# Patient Record
Sex: Male | Born: 1935 | Race: Black or African American | Hispanic: No | Smoking: Never smoker
Health system: Southern US, Community
[De-identification: ages and names within clinical notes are randomized; demographics above are authoritative.]

## PROBLEM LIST (undated history)

## (undated) DIAGNOSIS — E7800 Pure hypercholesterolemia, unspecified: Secondary | ICD-10-CM

## (undated) DIAGNOSIS — I10 Essential (primary) hypertension: Secondary | ICD-10-CM

## (undated) DIAGNOSIS — C61 Malignant neoplasm of prostate: Secondary | ICD-10-CM

## (undated) DIAGNOSIS — H8109 Meniere's disease, unspecified ear: Secondary | ICD-10-CM

## (undated) HISTORY — PX: OTHER SURGICAL HISTORY: SHX169

## (undated) HISTORY — PX: PACEMAKER IMPLANT: EP1218

---

## 2001-11-08 ENCOUNTER — Emergency Department (HOSPITAL_COMMUNITY): Admission: EM | Admit: 2001-11-08 | Discharge: 2001-11-08 | Payer: Self-pay | Admitting: Emergency Medicine

## 2002-01-03 ENCOUNTER — Encounter: Admission: RE | Admit: 2002-01-03 | Discharge: 2002-01-03 | Payer: Self-pay | Admitting: *Deleted

## 2002-01-03 ENCOUNTER — Encounter: Payer: Self-pay | Admitting: *Deleted

## 2002-01-24 ENCOUNTER — Ambulatory Visit (HOSPITAL_COMMUNITY): Admission: RE | Admit: 2002-01-24 | Discharge: 2002-01-24 | Payer: Self-pay | Admitting: *Deleted

## 2002-03-16 ENCOUNTER — Emergency Department (HOSPITAL_COMMUNITY): Admission: EM | Admit: 2002-03-16 | Discharge: 2002-03-16 | Payer: Self-pay | Admitting: Emergency Medicine

## 2002-07-15 ENCOUNTER — Encounter: Admission: RE | Admit: 2002-07-15 | Discharge: 2002-07-15 | Payer: Self-pay | Admitting: Urology

## 2002-07-15 ENCOUNTER — Encounter: Payer: Self-pay | Admitting: Urology

## 2002-11-15 ENCOUNTER — Encounter: Payer: Self-pay | Admitting: Family Medicine

## 2002-11-15 ENCOUNTER — Encounter: Admission: RE | Admit: 2002-11-15 | Discharge: 2002-11-15 | Payer: Self-pay | Admitting: Family Medicine

## 2003-06-04 ENCOUNTER — Encounter: Payer: Self-pay | Admitting: Emergency Medicine

## 2003-06-04 ENCOUNTER — Inpatient Hospital Stay (HOSPITAL_COMMUNITY): Admission: EM | Admit: 2003-06-04 | Discharge: 2003-06-05 | Payer: Self-pay | Admitting: Emergency Medicine

## 2004-09-27 ENCOUNTER — Emergency Department (HOSPITAL_COMMUNITY): Admission: EM | Admit: 2004-09-27 | Discharge: 2004-09-27 | Payer: Self-pay | Admitting: Emergency Medicine

## 2005-02-11 ENCOUNTER — Encounter: Admission: RE | Admit: 2005-02-11 | Discharge: 2005-02-11 | Payer: Self-pay | Admitting: Internal Medicine

## 2006-10-18 ENCOUNTER — Encounter: Admission: RE | Admit: 2006-10-18 | Discharge: 2006-10-18 | Payer: Self-pay | Admitting: Gastroenterology

## 2007-05-17 ENCOUNTER — Encounter: Admission: RE | Admit: 2007-05-17 | Discharge: 2007-05-17 | Payer: Self-pay | Admitting: Internal Medicine

## 2008-08-04 ENCOUNTER — Encounter: Admission: RE | Admit: 2008-08-04 | Discharge: 2008-08-04 | Payer: Self-pay | Admitting: Internal Medicine

## 2008-08-15 ENCOUNTER — Ambulatory Visit (HOSPITAL_COMMUNITY): Admission: RE | Admit: 2008-08-15 | Discharge: 2008-08-15 | Payer: Self-pay | Admitting: Internal Medicine

## 2010-01-13 ENCOUNTER — Emergency Department (HOSPITAL_COMMUNITY): Admission: EM | Admit: 2010-01-13 | Discharge: 2010-01-13 | Payer: Self-pay | Admitting: Emergency Medicine

## 2010-06-05 ENCOUNTER — Emergency Department (HOSPITAL_COMMUNITY): Admission: EM | Admit: 2010-06-05 | Discharge: 2010-06-06 | Payer: Self-pay | Admitting: Emergency Medicine

## 2010-12-04 ENCOUNTER — Emergency Department (HOSPITAL_COMMUNITY)
Admission: EM | Admit: 2010-12-04 | Discharge: 2010-12-04 | Disposition: A | Discharge status: Home / Self Care | Payer: Medicare Other | Attending: Emergency Medicine | Admitting: Emergency Medicine

## 2010-12-04 ENCOUNTER — Emergency Department (HOSPITAL_COMMUNITY): Payer: Medicare Other

## 2010-12-04 DIAGNOSIS — M79609 Pain in unspecified limb: Secondary | ICD-10-CM | POA: Insufficient documentation

## 2010-12-04 DIAGNOSIS — M25569 Pain in unspecified knee: Secondary | ICD-10-CM | POA: Insufficient documentation

## 2010-12-04 DIAGNOSIS — W11XXXA Fall on and from ladder, initial encounter: Secondary | ICD-10-CM | POA: Insufficient documentation

## 2010-12-04 DIAGNOSIS — E785 Hyperlipidemia, unspecified: Secondary | ICD-10-CM | POA: Insufficient documentation

## 2010-12-04 DIAGNOSIS — R209 Unspecified disturbances of skin sensation: Secondary | ICD-10-CM | POA: Insufficient documentation

## 2010-12-04 DIAGNOSIS — I10 Essential (primary) hypertension: Secondary | ICD-10-CM | POA: Insufficient documentation

## 2010-12-04 DIAGNOSIS — Z8546 Personal history of malignant neoplasm of prostate: Secondary | ICD-10-CM | POA: Insufficient documentation

## 2010-12-04 DIAGNOSIS — M5412 Radiculopathy, cervical region: Secondary | ICD-10-CM | POA: Insufficient documentation

## 2010-12-04 DIAGNOSIS — M25469 Effusion, unspecified knee: Secondary | ICD-10-CM | POA: Insufficient documentation

## 2010-12-04 DIAGNOSIS — Z79899 Other long term (current) drug therapy: Secondary | ICD-10-CM | POA: Insufficient documentation

## 2010-12-04 DIAGNOSIS — M25519 Pain in unspecified shoulder: Secondary | ICD-10-CM | POA: Insufficient documentation

## 2010-12-04 DIAGNOSIS — M546 Pain in thoracic spine: Secondary | ICD-10-CM | POA: Insufficient documentation

## 2010-12-04 LAB — POCT CARDIAC MARKERS: Myoglobin, poc: 335 ng/mL (ref 12–200)

## 2010-12-23 LAB — CBC
HCT: 38.7 % — ABNORMAL LOW (ref 39.0–52.0)
Hemoglobin: 13.3 g/dL (ref 13.0–17.0)
MCV: 93.3 fL (ref 78.0–100.0)
RBC: 4.15 MIL/uL — ABNORMAL LOW (ref 4.22–5.81)
RDW: 12 % (ref 11.5–15.5)
WBC: 7.9 10*3/uL (ref 4.0–10.5)

## 2010-12-23 LAB — DIFFERENTIAL
Basophils Absolute: 0 10*3/uL (ref 0.0–0.1)
Monocytes Absolute: 0.8 10*3/uL (ref 0.1–1.0)

## 2010-12-23 LAB — ROCKY MTN SPOTTED FVR AB, IGM-BLOOD: RMSF IgM: 0.12 IV (ref 0.00–0.89)

## 2010-12-23 LAB — BASIC METABOLIC PANEL
BUN: 12 mg/dL (ref 6–23)
Chloride: 109 mEq/L (ref 96–112)
Creatinine, Ser: 1.45 mg/dL (ref 0.4–1.5)
GFR calc non Af Amer: 48 mL/min — ABNORMAL LOW (ref >60)
Glucose, Bld: 131 mg/dL — ABNORMAL HIGH (ref 70–99)
Potassium: 4.2 mEq/L (ref 3.5–5.1)

## 2010-12-29 LAB — URINALYSIS, ROUTINE W REFLEX MICROSCOPIC
Hgb urine dipstick: NEGATIVE
Ketones, ur: 15 mg/dL — AB
Leukocytes, UA: NEGATIVE
Protein, ur: 30 mg/dL — AB
pH: 7 (ref 5.0–8.0)

## 2010-12-29 LAB — DIFFERENTIAL
Basophils Absolute: 0 10*3/uL (ref 0.0–0.1)
Eosinophils Absolute: 0 10*3/uL (ref 0.0–0.7)
Eosinophils Relative: 0 % (ref 0–5)
Lymphocytes Relative: 6 % — ABNORMAL LOW (ref 12–46)
Monocytes Relative: 9 % (ref 3–12)
Neutro Abs: 7.6 10*3/uL (ref 1.7–7.7)
Neutrophils Relative %: 85 % — ABNORMAL HIGH (ref 43–77)

## 2010-12-29 LAB — POCT I-STAT, CHEM 8
Calcium, Ion: 1.15 mmol/L (ref 1.12–1.32)
Chloride: 105 mEq/L (ref 96–112)
HCT: 50 % (ref 39.0–52.0)
Hemoglobin: 17 g/dL (ref 13.0–17.0)
Sodium: 141 mEq/L (ref 135–145)

## 2010-12-29 LAB — CBC
MCHC: 34.1 g/dL (ref 30.0–36.0)
MCV: 94.8 fL (ref 78.0–100.0)
RBC: 4.83 MIL/uL (ref 4.22–5.81)
RDW: 12.7 % (ref 11.5–15.5)
WBC: 8.9 10*3/uL (ref 4.0–10.5)

## 2010-12-29 LAB — URINE MICROSCOPIC-ADD ON

## 2011-02-25 NOTE — Discharge Summary (Signed)
   NAME:  Richard Osborne, Richard Osborne NO.:  [id number]   MEDICAL RECORD NO.:  [id number]                   PATIENT TYPE:  INP   LOCATION:  0345                                 FACILITY:  [facility]   PHYSICIAN:  Olene Craven, M.D.            DATE OF BIRTH:  September 28, 1934   DATE OF ADMISSION:  06/04/2003  DATE OF DISCHARGE:  06/05/2003                                 DISCHARGE SUMMARY   DISCHARGE DIAGNOSES:  1. Atypical chest pain.  2. Hypertension.  3. Benign prostatic hypertrophy.  4. Hyperlipidemia.   DISCHARGE MEDICATIONS:  1. Diovan HCT 160/12.5 mg p.o. daily.  2. Flomax 0.4 mg p.o. q.h.s.  3. Norvasc 5 mg p.o. daily.  4. Zetia 10 mg p.o. daily.  5. Zantac over-the-counter daily.   CONSULTATIONS:  Dr. Nicki Guadalajara, [facility].   PROCEDURES:  None.   FOLLOW-UP:  The patient has a scheduled appointment June 18, 2003, Dr.  Barbee Shropshire.  To follow up with Dr. Tresa Endo as directed.   HOSPITAL COURSE:  The patient was admitted on June 04, 2003, after  experiencing two recurring episodes of right-sided substernal chest pain not  associated with nausea, vomiting, diaphoresis, shortness of breath, or  radiation.  He did have a previous history of previous coronary  catheterization in 1999.  Records were not available at that time.  Subsequently faxed over, which showed he had a normal left ventricular  function, minimum nonstenotic coronary disease involving the proximal right  coronary artery.  No other atherosclerotic disease was identified, and this  was in November of 1999.  He was admitted for observation and rule out.  Serial enzymes were performed.  Enzymes were negative.  It was felt that the  patient was stable for discharge and could be set up for an outpatient  Cardiolite.  The Cardiolite will be scheduled.  He is being discharged in  stable condition, to follow up as above.                                               Olene Craven, M.D.    DEH/MEDQ  D:  06/05/2003  T:  06/05/2003  Job:  [id number]   cc:   Nicki Guadalajara, M.D.  [phone] [address], [address]  [place], [place] [address]  Fax: [phone]

## 2011-02-25 NOTE — H&P (Signed)
NAME:  Richard Osborne, Richard Osborne NO.:  [id number]   MEDICAL RECORD NO.:  [id number]                   PATIENT TYPE:  EMS   LOCATION:  ED                                   FACILITY:  [facility]   PHYSICIAN:  Wilson Singer, M.D.             DATE OF BIRTH:  Dec 18, 1933   DATE OF ADMISSION:  06/04/2003  DATE OF DISCHARGE:                                HISTORY & PHYSICAL   HISTORY:  This is a 77 year old man who has had several episodes of chest  pain in the last 48 hours.  The pain has occurred at rest usually, and is  not associated with nausea, sweating, or shortness of breath.  He has a  background history of hypertension and hypercholesterolemia.  He is  currently pain free.   PAST MEDICAL HISTORY:  Significant for benign prostatic hyperplasia,  hypertension, and hypercholesterolemia.  No other major illnesses.   MEDICATIONS:  1. Diovan HCT 160/12.5, one tablet daily.  2. Flomax 0.4 mg daily.   ALLERGIES:  None.   SOCIAL HISTORY:  This is a divorced gentleman who lives alone.  He is  retired and used to be a Naval architect.  He is a nonsmoker.  There is no  history of excessive alcohol use.   FAMILY HISTORY:  Significant for heart disease in his mother.   REVIEW OF SYSTEMS:  There are no other symptoms referable to the other  symptoms apart from the symptoms mentioned above, referable to the  constitutional, HEENT, cardiovascular, respiratory, gastrointestinal,  neurological, musculoskeletal, dermatological, endocrine, and psychiatric  systems.   PHYSICAL EXAMINATION:  VITAL SIGNS:  He is afebrile and hemodynamically  stable.  His blood pressure is 138/96, temperature 99.1, respirations 18.  Pulse oximetry on room air is 99%.  HEART:  Heart sounds are present and normal with no murmurs or pericardial  rubs.  There is no gallop present.  LUNGS:  Lung fields are clear.  ABDOMEN:  Soft and nontender with no hepatosplenomegaly.  NEUROLOGIC:  Intact.  No  focal neurologic signs.   INVESTIGATION:  Sodium 138, potassium 3.9, BUN 12, creatinine 1.2, glucose  124.  CPK is elevated at 371 with a CPK MB elevation at 6.1.  The relative  index, however, is in the normal range at 1.6.  Troponin is also in the  normal range at 0.03.  Chest x-ray is normal with no major abnormalities.   IMPRESSION AND PLAN:  Chest pain, possibly cardiac in origin.  The history  is not consistent entirely with cardiac pain, but he does have risk factors.  We will admit him for observation, telemetry, and also serial cardiac  enzymes and electrocardiograms.  He may need a stress test or even a cardiac  catheterization on this admission.  Further recommendations will depend on  the patient's progress, and he will be admitted to the service of Dr. Kern Reap.  Wilson Singer, M.D.    NCG/MEDQ  D:  06/04/2003  T:  06/04/2003  Job:  [id number]   cc:   Olene Craven, M.D.  [address]  [address]  [place]  [place] [address]  Fax: [phone]

## 2011-10-26 ENCOUNTER — Other Ambulatory Visit: Payer: Self-pay | Admitting: Urology

## 2011-10-26 DIAGNOSIS — C61 Malignant neoplasm of prostate: Secondary | ICD-10-CM

## 2011-11-07 ENCOUNTER — Inpatient Hospital Stay (HOSPITAL_COMMUNITY)
Admission: RE | Admit: 2011-11-07 | Discharge: 2011-11-07 | Payer: Medicare Other | Source: Ambulatory Visit | Attending: Urology | Admitting: Urology

## 2011-11-07 ENCOUNTER — Encounter (HOSPITAL_COMMUNITY): Payer: Medicare Other

## 2012-03-06 ENCOUNTER — Other Ambulatory Visit (HOSPITAL_COMMUNITY): Payer: Self-pay | Admitting: Gastroenterology

## 2012-03-12 ENCOUNTER — Encounter (HOSPITAL_COMMUNITY)
Admission: RE | Admit: 2012-03-12 | Discharge: 2012-03-12 | Disposition: A | Discharge status: Home / Self Care | Payer: Medicare Other | Source: Ambulatory Visit | Attending: Gastroenterology | Admitting: Gastroenterology

## 2012-03-12 DIAGNOSIS — R109 Unspecified abdominal pain: Secondary | ICD-10-CM | POA: Insufficient documentation

## 2012-03-12 MED ORDER — SINCALIDE 5 MCG IJ SOLR
INTRAMUSCULAR | Status: AC
  Administered 2012-03-12: 1.56 ug via INTRAVENOUS
  Filled 2012-03-12: qty 5

## 2012-03-12 MED ORDER — TECHNETIUM TC 99M MEBROFENIN IV KIT
5.0000 | PACK | Freq: Once | INTRAVENOUS | Status: AC | PRN
  Administered 2012-03-12: 5 via INTRAVENOUS

## 2012-03-12 MED ORDER — SINCALIDE 5 MCG IJ SOLR
0.0200 ug/kg | Freq: Once | INTRAMUSCULAR | Status: AC
  Administered 2012-03-12: 1.56 ug via INTRAVENOUS

## 2012-05-30 ENCOUNTER — Other Ambulatory Visit: Payer: Self-pay | Admitting: Gastroenterology

## 2012-05-30 DIAGNOSIS — R1013 Epigastric pain: Secondary | ICD-10-CM

## 2012-06-01 ENCOUNTER — Ambulatory Visit
Admission: RE | Admit: 2012-06-01 | Discharge: 2012-06-01 | Disposition: A | Discharge status: Home / Self Care | Payer: Medicare Other | Source: Ambulatory Visit | Attending: Gastroenterology | Admitting: Gastroenterology

## 2012-06-01 DIAGNOSIS — R1013 Epigastric pain: Secondary | ICD-10-CM

## 2012-06-01 MED ORDER — IOHEXOL 300 MG/ML  SOLN
100.0000 mL | Freq: Once | INTRAMUSCULAR | Status: AC | PRN
  Administered 2012-06-01: 100 mL via INTRAVENOUS

## 2013-07-12 ENCOUNTER — Other Ambulatory Visit: Payer: Self-pay | Admitting: Family

## 2013-07-12 ENCOUNTER — Ambulatory Visit
Admission: RE | Admit: 2013-07-12 | Discharge: 2013-07-12 | Disposition: A | Discharge status: Home / Self Care | Payer: Medicare Other | Source: Ambulatory Visit | Attending: Family | Admitting: Family

## 2013-07-12 DIAGNOSIS — T1490XA Injury, unspecified, initial encounter: Secondary | ICD-10-CM

## 2013-09-21 ENCOUNTER — Encounter (HOSPITAL_COMMUNITY): Payer: Self-pay | Admitting: Emergency Medicine

## 2013-09-21 ENCOUNTER — Emergency Department (HOSPITAL_COMMUNITY)
Admission: EM | Admit: 2013-09-21 | Discharge: 2013-09-21 | Disposition: A | Discharge status: Home / Self Care | Payer: Medicare Other | Attending: Emergency Medicine | Admitting: Emergency Medicine

## 2013-09-21 DIAGNOSIS — T7840XA Allergy, unspecified, initial encounter: Secondary | ICD-10-CM

## 2013-09-21 DIAGNOSIS — L299 Pruritus, unspecified: Secondary | ICD-10-CM | POA: Insufficient documentation

## 2013-09-21 DIAGNOSIS — E7800 Pure hypercholesterolemia, unspecified: Secondary | ICD-10-CM | POA: Insufficient documentation

## 2013-09-21 DIAGNOSIS — Z888 Allergy status to other drugs, medicaments and biological substances status: Secondary | ICD-10-CM | POA: Insufficient documentation

## 2013-09-21 DIAGNOSIS — T50995A Adverse effect of other drugs, medicaments and biological substances, initial encounter: Secondary | ICD-10-CM | POA: Insufficient documentation

## 2013-09-21 DIAGNOSIS — I10 Essential (primary) hypertension: Secondary | ICD-10-CM | POA: Insufficient documentation

## 2013-09-21 DIAGNOSIS — L509 Urticaria, unspecified: Secondary | ICD-10-CM | POA: Insufficient documentation

## 2013-09-21 DIAGNOSIS — R21 Rash and other nonspecific skin eruption: Secondary | ICD-10-CM

## 2013-09-21 HISTORY — DX: Essential (primary) hypertension: I10

## 2013-09-21 HISTORY — DX: Pure hypercholesterolemia, unspecified: E78.00

## 2013-09-21 MED ORDER — PREDNISONE 20 MG PO TABS
40.0000 mg | ORAL_TABLET | Freq: Once | ORAL | Status: AC
  Administered 2013-09-21: 40 mg via ORAL
  Filled 2013-09-21: qty 2

## 2013-09-21 MED ORDER — LORATADINE 10 MG PO TABS
10.0000 mg | ORAL_TABLET | Freq: Once | ORAL | Status: AC
  Administered 2013-09-21: 10 mg via ORAL
  Filled 2013-09-21: qty 1

## 2013-09-21 MED ORDER — PREDNISONE 20 MG PO TABS: 40.0000 mg | ORAL_TABLET | Freq: Every day | ORAL | Status: DC

## 2013-09-21 MED ORDER — DIPHENHYDRAMINE HCL 25 MG PO TABS: 25.0000 mg | ORAL_TABLET | Freq: Four times a day (QID) | ORAL | Status: DC | PRN

## 2013-09-21 NOTE — ED Notes (Signed)
Pt reports recently started taking cialis and now having rash and itching to entire body, has since stopped taking it but has not tried any benadryl for the rash. Airway intact, no acute distress noted.

## 2013-09-21 NOTE — ED Provider Notes (Signed)
CSN: [id number]     Arrival date & time 09/21/13  1227 History   First MD Initiated Contact with Patient 09/21/13 1301     Chief Complaint  Patient presents with  . Rash  . Allergic Reaction   (Consider location/radiation/quality/duration/timing/severity/associated sxs/prior Treatment) HPI Comments: Patient is a 79 yo M PMHx significant for HTN, hypercholesteremia presenting to the ED complaining of a gradually improving pruritic rash to back, bilateral upper and lower extremities that started one week ago. Patient states he was recently put on cialis by his doctor about 10 days ago. He states a few days after beginning the medication he developed the rash. He states he has not tried any Benadryl but did use some hydrocortisone cream. He states that has helped his rash. He denies any SOB or oropharyngeal swelling. Denies fevers. Patient has discontinued the drug after about 7 days of use.   Patient is a 79 y.o. male presenting with rash and allergic reaction.  Rash Associated symptoms: no fever, no shortness of breath and not wheezing   Allergic Reaction Presenting symptoms: rash   Presenting symptoms: no wheezing     Past Medical History  Diagnosis Date  . Hypertension   . High cholesterol    History reviewed. No pertinent past surgical history. History reviewed. No pertinent family history. History  Substance Use Topics  . Smoking status: Not on file  . Smokeless tobacco: Not on file  . Alcohol Use: No    Review of Systems  Constitutional: Negative for fever and diaphoresis.  Respiratory: Negative for shortness of breath, wheezing and stridor.   Skin: Positive for rash.  All other systems reviewed and are negative.    Allergies  Review of patient's allergies indicates no known allergies.  Home Medications   Current Outpatient Rx  Name  Route  Sig  Dispense  Refill  . diphenhydrAMINE (BENADRYL) 25 MG tablet   Oral   Take 1 tablet (25 mg total) by mouth every 6  (six) hours as needed for itching (Rash).   30 tablet   0   . predniSONE (DELTASONE) 20 MG tablet   Oral   Take 2 tablets (40 mg total) by mouth daily.   8 tablet   0    BP 114/67  Pulse 65  Temp(Src) 97.5 F (36.4 C) (Oral)  Resp 17  Ht 5\' 10"  (1.778 m)  Wt 184 lb 11.2 oz (83.779 kg)  BMI 26.50 kg/m2  SpO2 99% Physical Exam  Constitutional: He is oriented to person, place, and time. He appears well-developed and well-nourished. No distress.  HENT:  Head: Normocephalic and atraumatic.  Right Ear: External ear normal.  Left Ear: External ear normal.  Nose: Nose normal.  Mouth/Throat: Oropharynx is clear and moist. No oropharyngeal exudate.  Eyes: Conjunctivae are normal.  Neck: Normal range of motion. Neck supple.  Cardiovascular: Normal rate, regular rhythm, normal heart sounds and intact distal pulses.   Pulmonary/Chest: Effort normal and breath sounds normal. No stridor. No respiratory distress.  Abdominal: Soft. There is no tenderness.  Musculoskeletal: Normal range of motion.  Lymphadenopathy:    He has no cervical adenopathy.  Neurological: He is alert and oriented to person, place, and time.  Skin: Skin is warm and dry. Rash noted. No bruising, no ecchymosis and no petechiae noted. Rash is urticarial. He is not diaphoretic.     Area of rash marked. No drainage. Non-TTP.   Psychiatric: He has a normal mood and affect.    ED Course  Procedures (including critical care time) Medications  loratadine (CLARITIN) tablet 10 mg (10 mg Oral Given 09/21/13 1410)  predniSONE (DELTASONE) tablet 40 mg (40 mg Oral Given 09/21/13 1410)    Labs Review Labs Reviewed - No data to display Imaging Review No results found.  EKG Interpretation   None       MDM   1. Rash   2. Allergic reaction caused by a drug     Afebrile, NAD, non-toxic appearing, AAOx4. Patient re-evaluated prior to dc, is hemodynamically stable, in no respiratory distress, and denies the feeling  of throat closing. Pt has been advised to take OTC benadryl & return to the ED if they have a mod-severe allergic rxn (s/s including throat closing, difficulty breathing, swelling of lips face or tongue). Pt given short Prednisone course to help with rash and itching. Pt is to follow up with their PCP. Pt is agreeable with plan & verbalizes understanding. Patient d/w with Dr. Oletta Lamas, agrees with plan.        Jeannetta Ellis, PA-C 09/21/13 1446

## 2013-09-21 NOTE — ED Provider Notes (Signed)
Medical screening examination/treatment/procedure(s) were conducted as a shared visit with non-physician practitioner(s) and myself.  I personally evaluated the patient during the encounter.  EKG Interpretation   None       Pt with pruritic rash that he thinks began a few days after beginning [place]ais.  He noted itching mild at first along lower legs, lower back, but continued taking medication.  No difficulty breathing, no n/v, no fainting.  Pt stopped taking it 3 days ago and rash is improving but still very itchy on left lower leg.  Has not taken anything for itching.  Will recommend benadryl for symptoms, short course of steroids and follow up with PMD as needed.    Gavin Pound. Fabiano Ginley, MD 09/21/13 1410

## 2014-05-07 ENCOUNTER — Ambulatory Visit
Admission: RE | Admit: 2014-05-07 | Discharge: 2014-05-07 | Disposition: A | Discharge status: Home / Self Care | Payer: Medicare Other | Source: Ambulatory Visit | Attending: Family | Admitting: Family

## 2014-05-07 ENCOUNTER — Other Ambulatory Visit: Payer: Self-pay | Admitting: Family

## 2014-05-07 DIAGNOSIS — M79642 Pain in left hand: Secondary | ICD-10-CM

## 2014-05-24 ENCOUNTER — Encounter (HOSPITAL_COMMUNITY): Payer: Self-pay | Admitting: Emergency Medicine

## 2014-05-24 ENCOUNTER — Emergency Department (INDEPENDENT_AMBULATORY_CARE_PROVIDER_SITE_OTHER)
Admission: EM | Admit: 2014-05-24 | Discharge: 2014-05-24 | Disposition: A | Discharge status: Home / Self Care | Payer: Medicare Other | Source: Home / Self Care | Attending: Family Medicine | Admitting: Family Medicine

## 2014-05-24 DIAGNOSIS — J029 Acute pharyngitis, unspecified: Secondary | ICD-10-CM

## 2014-05-24 DIAGNOSIS — L559 Sunburn, unspecified: Secondary | ICD-10-CM

## 2014-05-24 LAB — POCT RAPID STREP A: Streptococcus, Group A Screen (Direct): NEGATIVE

## 2014-05-24 NOTE — Discharge Instructions (Signed)
Pharyngitis Pharyngitis is redness, pain, and swelling (inflammation) of your pharynx.  CAUSES  Pharyngitis is usually caused by infection. Most of the time, these infections are from viruses (viral) and are part of a cold. However, sometimes pharyngitis is caused by bacteria (bacterial). Pharyngitis can also be caused by allergies. Viral pharyngitis may be spread from person to person by coughing, sneezing, and personal items or utensils (cups, forks, spoons, toothbrushes). Bacterial pharyngitis may be spread from person to person by more intimate contact, such as kissing.  SIGNS AND SYMPTOMS  Symptoms of pharyngitis include:   Sore throat.   Tiredness (fatigue).   Low-grade fever.   Headache.  Joint pain and muscle aches.  Skin rashes.  Swollen lymph nodes.  Plaque-like film on throat or tonsils (often seen with bacterial pharyngitis). DIAGNOSIS  Your health care provider will ask you questions about your illness and your symptoms. Your medical history, along with a physical exam, is often all that is needed to diagnose pharyngitis. Sometimes, a rapid strep test is done. Other lab tests may also be done, depending on the suspected cause.  TREATMENT  Viral pharyngitis will usually get better in 3-4 days without the use of medicine. Bacterial pharyngitis is treated with medicines that kill germs (antibiotics).  HOME CARE INSTRUCTIONS   Drink enough water and fluids to keep your urine clear or pale yellow.   Only take over-the-counter or prescription medicines as directed by your health care provider:   If you are prescribed antibiotics, make sure you finish them even if you start to feel better.   Do not take aspirin.   Get lots of rest.   Gargle with 8 oz of salt water ( tsp of salt per 1 qt of water) as often as every 1-2 hours to soothe your throat.   Throat lozenges (if you are not at risk for choking) or sprays may be used to soothe your throat. SEEK MEDICAL  CARE IF:   You have large, tender lumps in your neck.  You have a rash.  You cough up green, yellow-brown, or bloody spit. SEEK IMMEDIATE MEDICAL CARE IF:   Your neck becomes stiff.  You drool or are unable to swallow liquids.  You vomit or are unable to keep medicines or liquids down.  You have severe pain that does not go away with the use of recommended medicines.  You have trouble breathing (not caused by a stuffy nose). MAKE SURE YOU:   Understand these instructions.  Will watch your condition.  Will get help right away if you are not doing well or get worse. Document Released: 09/26/2005 Document Revised: 07/17/2013 Document Reviewed: 06/03/2013 [facility] Patient Information 2015 [place], [place]. This information is not intended to replace advice given to you by your health care provider. Make sure you discuss any questions you have with your health care provider.  Sunburn Sunburn is damage to the skin caused by overexposure to ultraviolet (UV) rays. People with light skin or a fair complexion may be more susceptible to sunburn. Repeated sun exposure causes early skin aging such as wrinkles and sun spots. It also increases the risk of skin cancer. CAUSES A sunburn is caused by getting too much UV radiation from the sun. SYMPTOMS  Red or pink skin.  Soreness and swelling.  Pain.  Blisters.  Peeling skin.  Headache, fever, and fatigue if sunburn covers a large area. TREATMENT  Your caregiver may tell you to take certain medicines to lessen inflammation.  Your caregiver may  have you use hydrocortisone cream or spray to help with itching and inflammation.  Your caregiver may prescribe an antibiotic cream to use on blisters. HOME CARE INSTRUCTIONS   Avoid further exposure to the sun.  Cool baths and cool compresses may be helpful if used several times per day. Do not apply ice, since this may result in more damage to the skin.  Only take over-the-counter or  prescription medicines for pain, discomfort, or fever as directed by your caregiver.  Use aloe or other over-the-counter sunburn creams or gels on your skin. Do not apply these creams or gels on blisters.  Drink enough fluids to keep your urine clear or pale yellow.  Do not break blisters. If blisters break, your caregiver may recommend an antibiotic cream to apply to the affected area. PREVENTION   Try to avoid the sun between 10:00 a.m. and 4:00 p.m. when it is the strongest.  Apply sunscreen at least 30 minutes before exposure to the sun.  Always wear protective hats, clothing, and sunglasses with UV protection.  Avoid medicines, herbs, and foods that increase your sensitivity to sunlight.  Avoid tanning beds. SEEK IMMEDIATE MEDICAL CARE IF:   You have a fever.  Your pain is uncontrolled with medicine.  You start to vomit or have diarrhea.  You feel faint or develop a headache with confusion.  You develop severe blistering.  You have a pus-like (purulent) discharge coming from the blisters.  Your burn becomes more painful and swollen. MAKE SURE YOU:  Understand these instructions.  Will watch your condition.  Will get help right away if you are not doing well or get worse. Document Released: 07/06/2005 Document Revised: 01/21/2013 Document Reviewed: 03/20/2011 [facility] Patient Information 2015 [place], [place]. This information is not intended to replace advice given to you by your health care provider. Make sure you discuss any questions you have with your health care provider.

## 2014-05-24 NOTE — ED Provider Notes (Signed)
Medical screening examination/treatment/procedure(s) were performed by resident physician or non-physician practitioner and as supervising physician I was immediately available for consultation/collaboration.   Pauline Good MD.   Billy Fischer, MD 05/24/14 1534

## 2014-05-24 NOTE — ED Notes (Signed)
Pt c/o poss insect bite to back of neck/upper back onset 3 days Sx include swelling and redness Also reports cough and ST Alert; no signs of acute distress.

## 2014-05-24 NOTE — ED Provider Notes (Signed)
CSN: [id number]     Arrival date & time 05/24/14  1247 History   First MD Initiated Contact with Patient 05/24/14 1303     Chief Complaint  Patient presents with  . Insect Bite   (Consider location/radiation/quality/duration/timing/severity/associated sxs/prior Treatment) HPI Comments: 80 year old male presents for evaluation of what he believes to be some sort of severe insect bite or spider bite. He has had a very mild sore throat and slight cough for 3 days as well as some mild random intermittent pain in the back of his neck. This morning he also noted some redness on the back of his neck.this area of redness is mildly sore. denies any other symptoms. No treatments tried at home. He states repeatedly that he has spiders in the house.    Past Medical History  Diagnosis Date  . Hypertension   . High cholesterol    History reviewed. No pertinent past surgical history. No family history on file. History  Substance Use Topics  . Smoking status: Never Smoker   . Smokeless tobacco: Not on file  . Alcohol Use: No    Review of Systems  Constitutional: Negative for fever and chills.  HENT: Positive for congestion, rhinorrhea and sore throat. Negative for postnasal drip.   Respiratory: Positive for cough. Negative for chest tightness, shortness of breath and wheezing.   Skin: Positive for rash.  All other systems reviewed and are negative.   Allergies  Review of patient's allergies indicates no known allergies.  Home Medications   Prior to Admission medications   Medication Sig Start Date End Date Taking? Authorizing Provider  diphenhydrAMINE (BENADRYL) 25 MG tablet Take 1 tablet (25 mg total) by mouth every 6 (six) hours as needed for itching (Rash). 09/21/13   Jennifer L Piepenbrink, PA-C  predniSONE (DELTASONE) 20 MG tablet Take 2 tablets (40 mg total) by mouth daily. 09/21/13   Jennifer L Piepenbrink, PA-C   BP 163/83  Pulse 67  Temp(Src) 98.3 F (36.8 C) (Oral)  Resp 18   SpO2 99% Physical Exam  Nursing note and vitals reviewed. Constitutional: He is oriented to person, place, and time. He appears well-developed and well-nourished. No distress.  HENT:  Head: Normocephalic and atraumatic.  Right Ear: Tympanic membrane, external ear and ear canal normal.  Left Ear: Tympanic membrane, external ear and ear canal normal.  Nose: Nose normal. Right sinus exhibits no maxillary sinus tenderness and no frontal sinus tenderness. Left sinus exhibits no maxillary sinus tenderness and no frontal sinus tenderness.  Mouth/Throat: Uvula is midline and oropharynx is clear and moist. No oropharyngeal exudate or posterior oropharyngeal erythema.  Cardiovascular: Normal rate, regular rhythm and normal heart sounds.   Pulmonary/Chest: Effort normal and breath sounds normal. No respiratory distress.  Abdominal: Soft. He exhibits no mass. There is no tenderness. There is no rebound and no guarding.  Neurological: He is alert and oriented to person, place, and time. Coordination normal.  Skin: Skin is warm and dry. Burn (there is a sunburn on the back of his neck) noted. No rash noted. He is not diaphoretic.  Psychiatric: He has a normal mood and affect. Judgment normal.    ED Course  Procedures (including critical care time) Labs Review Labs Reviewed  POCT RAPID STREP A ([facility]RG CARE ONLY)    Imaging Review No results found.   MDM   1. Pharyngitis   2. Sunburn    Treat symptomatically.  F/U PRN       Liam Graham, PA-C 05/24/14  1446 

## 2014-05-26 LAB — CULTURE, GROUP A STREP

## 2015-01-18 ENCOUNTER — Encounter (HOSPITAL_BASED_OUTPATIENT_CLINIC_OR_DEPARTMENT_OTHER): Payer: Self-pay | Admitting: *Deleted

## 2015-01-18 ENCOUNTER — Emergency Department (HOSPITAL_BASED_OUTPATIENT_CLINIC_OR_DEPARTMENT_OTHER)
Admission: EM | Admit: 2015-01-18 | Discharge: 2015-01-18 | Disposition: A | Discharge status: Home / Self Care | Payer: Medicare Other | Attending: Emergency Medicine | Admitting: Emergency Medicine

## 2015-01-18 DIAGNOSIS — Z8546 Personal history of malignant neoplasm of prostate: Secondary | ICD-10-CM | POA: Diagnosis not present

## 2015-01-18 DIAGNOSIS — Y998 Other external cause status: Secondary | ICD-10-CM | POA: Insufficient documentation

## 2015-01-18 DIAGNOSIS — Z8639 Personal history of other endocrine, nutritional and metabolic disease: Secondary | ICD-10-CM | POA: Diagnosis not present

## 2015-01-18 DIAGNOSIS — Z8669 Personal history of other diseases of the nervous system and sense organs: Secondary | ICD-10-CM | POA: Diagnosis not present

## 2015-01-18 DIAGNOSIS — R202 Paresthesia of skin: Secondary | ICD-10-CM | POA: Diagnosis not present

## 2015-01-18 DIAGNOSIS — I10 Essential (primary) hypertension: Secondary | ICD-10-CM | POA: Diagnosis not present

## 2015-01-18 DIAGNOSIS — Y9289 Other specified places as the place of occurrence of the external cause: Secondary | ICD-10-CM | POA: Insufficient documentation

## 2015-01-18 DIAGNOSIS — Z79899 Other long term (current) drug therapy: Secondary | ICD-10-CM | POA: Diagnosis not present

## 2015-01-18 DIAGNOSIS — Y9389 Activity, other specified: Secondary | ICD-10-CM | POA: Insufficient documentation

## 2015-01-18 DIAGNOSIS — S80862A Insect bite (nonvenomous), left lower leg, initial encounter: Secondary | ICD-10-CM | POA: Insufficient documentation

## 2015-01-18 DIAGNOSIS — W57XXXA Bitten or stung by nonvenomous insect and other nonvenomous arthropods, initial encounter: Secondary | ICD-10-CM | POA: Insufficient documentation

## 2015-01-18 DIAGNOSIS — Z7952 Long term (current) use of systemic steroids: Secondary | ICD-10-CM | POA: Insufficient documentation

## 2015-01-18 DIAGNOSIS — R200 Anesthesia of skin: Secondary | ICD-10-CM | POA: Insufficient documentation

## 2015-01-18 HISTORY — DX: Meniere's disease, unspecified ear: H81.09

## 2015-01-18 HISTORY — DX: Malignant neoplasm of prostate: C61

## 2015-01-18 LAB — CBG MONITORING, ED: Glucose-Capillary: 85 mg/dL (ref 70–99)

## 2015-01-18 NOTE — ED Provider Notes (Signed)
CSN: [id number]     Arrival date & time 01/18/15  7622 History  This chart was scribed for Wandra Arthurs, MD by Edison Simon, ED Scribe. This patient was seen in room MHFT1/MHFT1 and the patient's care was started at 7:30 PM.    Chief Complaint  Patient presents with  . Insect Bite   The history is provided by the patient. No language interpreter was used.    HPI Comments: Richard Osborne is a 81 y.o. male who presents to the Emergency Department complaining of insect bite to left lower leg with onset 3 weeks ago. He states his left lower leg feels numb now. He states he felt the insect crawling on his hand but is unsure what he was bitten by. He read about spiders online and is concerned. He denies history of DM. He denies numbness to his right leg.   Past Medical History  Diagnosis Date  . Hypertension   . High cholesterol   . Meniere's disease   . Prostate cancer    Past Surgical History  Procedure Laterality Date  . Otic nerve surgery     No family history on file. History  Substance Use Topics  . Smoking status: Never Smoker   . Smokeless tobacco: Not on file  . Alcohol Use: No    Review of Systems  Skin:       Insect bite  Neurological: Positive for numbness.  All other systems reviewed and are negative.     Allergies  Review of patient's allergies indicates no known allergies.  Home Medications   Prior to Admission medications   Medication Sig Start Date End Date Taking? Authorizing Provider  tamsulosin (FLOMAX) 0.4 MG CAPS capsule Take 0.4 mg by mouth.   Yes Historical Provider, MD  diphenhydrAMINE (BENADRYL) 25 MG tablet Take 1 tablet (25 mg total) by mouth every 6 (six) hours as needed for itching (Rash). 09/21/13   Jennifer Piepenbrink, PA-C  predniSONE (DELTASONE) 20 MG tablet Take 2 tablets (40 mg total) by mouth daily. 09/21/13   Jennifer Piepenbrink, PA-C   BP 174/86 mmHg  Pulse 57  Temp(Src) 98.3 F (36.8 C) (Oral)  Resp 18  Ht 5\' 10"  (1.778 m)   Wt 175 lb (79.379 kg)  BMI 25.11 kg/m2  SpO2 100% Physical Exam  Constitutional: He is oriented to person, place, and time. He appears well-developed and well-nourished.  HENT:  Head: Normocephalic and atraumatic.  Eyes: Conjunctivae are normal.  Neck: Normal range of motion. Neck supple.  Pulmonary/Chest: Effort normal.  Musculoskeletal: Normal range of motion.  Neurological: He is alert and oriented to person, place, and time.  Skin: Skin is warm and dry.  Small bite mark L calf with no obvious surrounding erythema. Slightly dec sensation surrounding that area but nl plantar flexion and dorsiflexion, 2+ pulses.   Psychiatric: He has a normal mood and affect.  Nursing note and vitals reviewed.   ED Course  Procedures (including critical care time)  DIAGNOSTIC STUDIES: Oxygen Saturation is 100% on room air, normal by my interpretation.    COORDINATION OF CARE: 7:33 PM Discussed treatment plan with patient at beside, including checking his blood sugar. The patient agrees with the plan and has no further questions at this time.   Labs Review Labs Reviewed  CBG MONITORING, ED    Imaging Review No results found.   EKG Interpretation None      MDM   Final diagnoses:  None   Richard Osborne is  a 81 y.o. male here with paresthesia around a bite. The bite doesn't appear infected. No signs of cellulitis. CBG nl so not diabetic neuropathy. He has sensation but just decreased. Neurologically intact otherwise. Reassured patient. Will dc home.    I personally performed the services described in this documentation, which was scribed in my presence. The recorded information has been reviewed and is accurate.   Wandra Arthurs, MD 01/18/15 2015

## 2015-01-18 NOTE — Discharge Instructions (Signed)
Take tylenol or motrin if you have pain.   Observe the wound. If you have more redness and pain then return to the ED.   See your doctor.

## 2015-01-18 NOTE — ED Notes (Signed)
Insect bite to left ankle 3 weeks ago- states area feels numb now- read about spider bites online and wants to have it checked- ambulatory with steady gait

## 2015-02-28 DIAGNOSIS — E785 Hyperlipidemia, unspecified: Secondary | ICD-10-CM | POA: Diagnosis present

## 2015-06-08 ENCOUNTER — Other Ambulatory Visit: Payer: Self-pay | Admitting: Family

## 2015-06-08 ENCOUNTER — Ambulatory Visit
Admission: RE | Admit: 2015-06-08 | Discharge: 2015-06-08 | Disposition: A | Discharge status: Home / Self Care | Payer: Medicare Other | Source: Ambulatory Visit | Attending: Family | Admitting: Family

## 2015-06-08 DIAGNOSIS — R059 Cough, unspecified: Secondary | ICD-10-CM

## 2015-06-08 DIAGNOSIS — R05 Cough: Secondary | ICD-10-CM

## 2015-07-11 DIAGNOSIS — N400 Enlarged prostate without lower urinary tract symptoms: Secondary | ICD-10-CM | POA: Diagnosis present

## 2015-11-03 ENCOUNTER — Ambulatory Visit: Payer: Medicare Other | Admitting: Internal Medicine

## 2016-03-11 DIAGNOSIS — Z950 Presence of cardiac pacemaker: Secondary | ICD-10-CM | POA: Diagnosis present

## 2017-05-15 ENCOUNTER — Other Ambulatory Visit: Payer: Self-pay | Admitting: Gastroenterology

## 2017-05-15 DIAGNOSIS — R140 Abdominal distension (gaseous): Secondary | ICD-10-CM

## 2017-05-15 DIAGNOSIS — R109 Unspecified abdominal pain: Secondary | ICD-10-CM

## 2017-05-18 ENCOUNTER — Ambulatory Visit
Admission: RE | Admit: 2017-05-18 | Discharge: 2017-05-18 | Disposition: A | Discharge status: Home / Self Care | Payer: Medicare Other | Source: Ambulatory Visit | Attending: Gastroenterology | Admitting: Gastroenterology

## 2017-05-18 DIAGNOSIS — R140 Abdominal distension (gaseous): Secondary | ICD-10-CM

## 2017-05-18 DIAGNOSIS — R109 Unspecified abdominal pain: Secondary | ICD-10-CM

## 2017-05-18 MED ORDER — IOPAMIDOL (ISOVUE-300) INJECTION 61%
100.0000 mL | Freq: Once | INTRAVENOUS | Status: AC | PRN
  Administered 2017-05-18: 100 mL via INTRAVENOUS

## 2018-09-19 ENCOUNTER — Encounter (HOSPITAL_BASED_OUTPATIENT_CLINIC_OR_DEPARTMENT_OTHER): Payer: Self-pay | Admitting: *Deleted

## 2018-09-19 ENCOUNTER — Other Ambulatory Visit: Payer: Self-pay

## 2018-09-19 ENCOUNTER — Emergency Department (HOSPITAL_BASED_OUTPATIENT_CLINIC_OR_DEPARTMENT_OTHER)
Admission: EM | Admit: 2018-09-19 | Discharge: 2018-09-19 | Disposition: A | Discharge status: Home / Self Care | Payer: Medicare Other | Attending: Emergency Medicine | Admitting: Emergency Medicine

## 2018-09-19 DIAGNOSIS — M7918 Myalgia, other site: Secondary | ICD-10-CM | POA: Diagnosis not present

## 2018-09-19 DIAGNOSIS — M791 Myalgia, unspecified site: Secondary | ICD-10-CM

## 2018-09-19 DIAGNOSIS — Z950 Presence of cardiac pacemaker: Secondary | ICD-10-CM | POA: Diagnosis not present

## 2018-09-19 DIAGNOSIS — Z8546 Personal history of malignant neoplasm of prostate: Secondary | ICD-10-CM | POA: Insufficient documentation

## 2018-09-19 DIAGNOSIS — R1030 Lower abdominal pain, unspecified: Secondary | ICD-10-CM | POA: Diagnosis present

## 2018-09-19 DIAGNOSIS — I10 Essential (primary) hypertension: Secondary | ICD-10-CM | POA: Diagnosis not present

## 2018-09-19 LAB — COMPREHENSIVE METABOLIC PANEL
ALT: 24 U/L (ref 0–44)
AST: 30 U/L (ref 15–41)
Albumin: 4 g/dL (ref 3.5–5.0)
Alkaline Phosphatase: 52 U/L (ref 38–126)
Anion gap: 7 (ref 5–15)
BUN: 26 mg/dL — ABNORMAL HIGH (ref 8–23)
CO2: 24 mmol/L (ref 22–32)
Calcium: 9.2 mg/dL (ref 8.9–10.3)
Chloride: 111 mmol/L (ref 98–111)
Creatinine, Ser: 1.43 mg/dL — ABNORMAL HIGH (ref 0.61–1.24)
GFR calc non Af Amer: 45 mL/min — ABNORMAL LOW (ref >60)
GFR, EST AFRICAN AMERICAN: 52 mL/min — AB (ref >60)
Glucose, Bld: 93 mg/dL (ref 70–99)
Potassium: 4.2 mmol/L (ref 3.5–5.1)
Sodium: 142 mmol/L (ref 135–145)
Total Bilirubin: 0.6 mg/dL (ref 0.3–1.2)
Total Protein: 7.5 g/dL (ref 6.5–8.1)

## 2018-09-19 LAB — CBC WITH DIFFERENTIAL/PLATELET
ABS IMMATURE GRANULOCYTES: 0.01 10*3/uL (ref 0.00–0.07)
BASOS PCT: 0 %
Basophils Absolute: 0 10*3/uL (ref 0.0–0.1)
Eosinophils Absolute: 0.1 10*3/uL (ref 0.0–0.5)
Eosinophils Relative: 1 %
HEMATOCRIT: 41.8 % (ref 39.0–52.0)
Hemoglobin: 12.9 g/dL — ABNORMAL LOW (ref 13.0–17.0)
Immature Granulocytes: 0 %
Lymphocytes Relative: 20 %
Lymphs Abs: 1.6 10*3/uL (ref 0.7–4.0)
MCH: 29.8 pg (ref 26.0–34.0)
MCHC: 30.9 g/dL (ref 30.0–36.0)
MCV: 96.5 fL (ref 80.0–100.0)
MONOS PCT: 11 %
Monocytes Absolute: 0.8 10*3/uL (ref 0.1–1.0)
NEUTROS ABS: 5.2 10*3/uL (ref 1.7–7.7)
Neutrophils Relative %: 68 %
Platelets: 231 10*3/uL (ref 150–400)
RBC: 4.33 MIL/uL (ref 4.22–5.81)
RDW: 13 % (ref 11.5–15.5)
WBC: 7.7 10*3/uL (ref 4.0–10.5)
nRBC: 0 % (ref 0.0–0.2)

## 2018-09-19 LAB — URINALYSIS, ROUTINE W REFLEX MICROSCOPIC
Bilirubin Urine: NEGATIVE
Glucose, UA: NEGATIVE mg/dL
Hgb urine dipstick: NEGATIVE
Ketones, ur: NEGATIVE mg/dL
Leukocytes, UA: NEGATIVE
NITRITE: NEGATIVE
PROTEIN: NEGATIVE mg/dL
Specific Gravity, Urine: 1.015 (ref 1.005–1.030)
pH: 6.5 (ref 5.0–8.0)

## 2018-09-19 NOTE — ED Notes (Signed)
Pt verbalized understanding of dc instructions.

## 2018-09-19 NOTE — ED Triage Notes (Signed)
Pt says that on Sunday he was working in his garage and he felt like something bit him, he went inside, had abdominal pain and vomiting that "lasted 2-3 minutes", then started having "soreness" on bilateral buttocks that has become worse.

## 2018-09-19 NOTE — ED Provider Notes (Signed)
[place] EMERGENCY DEPARTMENT Provider Note   CSN: [id number] Arrival date & time: 09/19/18  0557     History   Chief Complaint Chief Complaint  Patient presents with  . Insect Bite    HPI Richard Osborne is a 84 y.o. male.  84 year old male with past medical history below including hypertension, hyperlipidemia, Mnire's disease, pacemaker, prostate cancer who presents with concern for insect bite.  3 days ago he was working in his garage when he felt like something bit him on his posterior right thigh.  He did not think much of it and a few hours later went inside when he had a sudden sharp pain in his lower abdomen followed by one episode of vomiting.  All of his symptoms resolved after this.  The following day he noticed that he had some soreness in his bilateral buttocks right worse than left.  He states that it is not a sharp pain or a severe pain but is just a muscle soreness that he notices more when he goes to sit down.  He denies any associated leg weakness or numbness.  No bowel or bladder problems.  No ongoing nausea, vomiting, diarrhea, muscle cramping, fevers, URI symptoms, rash, or other complaints.  He talked to a family member who was concerned about a spider bite and told him that he may need lab work.  The history is provided by the patient.    Past Medical History:  Diagnosis Date  . High cholesterol   . Hypertension   . Meniere's disease   . Prostate cancer ([place])     There are no active problems to display for this patient.   Past Surgical History:  Procedure Laterality Date  . otic nerve surgery    . PACEMAKER IMPLANT          Home Medications    Prior to Admission medications   Medication Sig Start Date End Date Taking? Authorizing Provider  diphenhydrAMINE (BENADRYL) 25 MG tablet Take 1 tablet (25 mg total) by mouth every 6 (six) hours as needed for itching (Rash). 09/21/13   Piepenbrink, Anderson Malta, PA-C  predniSONE (DELTASONE) 20  MG tablet Take 2 tablets (40 mg total) by mouth daily. 09/21/13   Piepenbrink, Anderson Malta, PA-C  tamsulosin (FLOMAX) 0.4 MG CAPS capsule Take 0.4 mg by mouth.    [provider]    Family History No family history on file.  Social History Social History   Tobacco Use  . Smoking status: Never Smoker  Substance Use Topics  . Alcohol use: No  . Drug use: No     Allergies   Patient has no known allergies.   Review of Systems Review of Systems All other systems reviewed and are negative except that which was mentioned in HPI   Physical Exam Updated Vital Signs BP (!) 172/94   Pulse (!) 58   Temp 98.1 F (36.7 C) (Oral)   Resp 18   SpO2 99%   Physical Exam  Constitutional: He is oriented to person, place, and time. He appears well-developed and well-nourished. No distress.  HENT:  Head: Normocephalic and atraumatic.  Moist mucous membranes  Eyes: Pupils are equal, round, and reactive to light. Conjunctivae are normal.  Neck: Neck supple.  Cardiovascular: Normal rate, regular rhythm and normal heart sounds.  No murmur heard. Pulmonary/Chest: Effort normal and breath sounds normal.  Abdominal: Soft. Bowel sounds are normal. He exhibits no distension. There is no tenderness.  Musculoskeletal: He exhibits no edema or  tenderness.  Neurological: He is alert and oriented to person, place, and time.  Fluent speech  Skin: Skin is warm and dry. No rash noted.  No insect bites or skin changes on buttocks or posterior legs  Psychiatric: He has a normal mood and affect. Judgment normal.  Nursing note and vitals reviewed. Chaperone was present during exam.    ED Treatments / Results  Labs (all labs ordered are listed, but only abnormal results are displayed) Labs Reviewed  COMPREHENSIVE METABOLIC PANEL - Abnormal; Notable for the following components:      Result Value   BUN 26 (*)    Creatinine, Ser 1.43 (*)    GFR calc non Af Amer 45 (*)    GFR calc Af Amer 52  (*)    All other components within normal limits  CBC WITH DIFFERENTIAL/PLATELET - Abnormal; Notable for the following components:   Hemoglobin 12.9 (*)    All other components within normal limits  URINALYSIS, ROUTINE W REFLEX MICROSCOPIC    EKG None  Radiology No results found.  Procedures Procedures (including critical care time)  Medications Ordered in ED Medications - No data to display   Initial Impression / Assessment and Plan / ED Course  I have reviewed the triage vital signs and the nursing notes.  Pertinent labs & imaging results that were available during my care of the patient were reviewed by me and considered in my medical decision making (see chart for details).     No findings on visual exam. I explained that if he had had a brown recluse bite, he likely have evidence of skin necrosis.  If he had had a black widow envenomation, he likely would have had ongoing severe GI symptoms and other symptoms that would be persistent 3 days ago and likely would have required sooner medical attention.  He has no ongoing symptoms to suggest envenomation.  He was insistent on lab work therefore obtain screening CMP, CBC, UA.  UA normal without hematuria.  CBC and CMP reassuring, creatinine similar to previous values.  Well-appearing on reassessment.  Discussed supportive measures including Tylenol as needed for muscle soreness and reviewed return precautions.  Patient voiced understanding.  Final Clinical Impressions(s) / ED Diagnoses   Final diagnoses:  Muscle soreness    ED Discharge Orders    None       Little, Wenda Overland, MD 09/19/18 [phone]

## 2019-06-29 ENCOUNTER — Other Ambulatory Visit: Payer: Self-pay

## 2019-06-29 DIAGNOSIS — Z20822 Contact with and (suspected) exposure to covid-19: Secondary | ICD-10-CM

## 2019-06-30 LAB — NOVEL CORONAVIRUS, NAA: SARS-CoV-2, NAA: NOT DETECTED

## 2019-07-01 ENCOUNTER — Telehealth: Payer: Self-pay | Admitting: General Practice

## 2019-07-01 NOTE — Telephone Encounter (Signed)
Pt aware covid lab test negative, not detected °

## 2020-01-02 DIAGNOSIS — N1831 Chronic kidney disease, stage 3a: Secondary | ICD-10-CM | POA: Insufficient documentation

## 2020-02-26 DIAGNOSIS — K219 Gastro-esophageal reflux disease without esophagitis: Secondary | ICD-10-CM | POA: Diagnosis present

## 2020-12-01 DIAGNOSIS — Z8546 Personal history of malignant neoplasm of prostate: Secondary | ICD-10-CM

## 2021-02-08 ENCOUNTER — Encounter (HOSPITAL_BASED_OUTPATIENT_CLINIC_OR_DEPARTMENT_OTHER): Payer: Self-pay | Admitting: Emergency Medicine

## 2021-02-08 ENCOUNTER — Other Ambulatory Visit (HOSPITAL_BASED_OUTPATIENT_CLINIC_OR_DEPARTMENT_OTHER): Payer: Self-pay

## 2021-02-08 ENCOUNTER — Emergency Department (HOSPITAL_BASED_OUTPATIENT_CLINIC_OR_DEPARTMENT_OTHER)
Admission: EM | Admit: 2021-02-08 | Discharge: 2021-02-08 | Disposition: A | Discharge status: Home / Self Care | Payer: Medicare Other | Attending: Emergency Medicine | Admitting: Emergency Medicine

## 2021-02-08 ENCOUNTER — Other Ambulatory Visit: Payer: Self-pay

## 2021-02-08 ENCOUNTER — Emergency Department (HOSPITAL_BASED_OUTPATIENT_CLINIC_OR_DEPARTMENT_OTHER): Payer: Medicare Other

## 2021-02-08 DIAGNOSIS — M25462 Effusion, left knee: Secondary | ICD-10-CM | POA: Diagnosis not present

## 2021-02-08 DIAGNOSIS — Z8546 Personal history of malignant neoplasm of prostate: Secondary | ICD-10-CM | POA: Insufficient documentation

## 2021-02-08 DIAGNOSIS — I10 Essential (primary) hypertension: Secondary | ICD-10-CM | POA: Insufficient documentation

## 2021-02-08 DIAGNOSIS — M25562 Pain in left knee: Secondary | ICD-10-CM | POA: Diagnosis present

## 2021-02-08 DIAGNOSIS — M1120 Other chondrocalcinosis, unspecified site: Secondary | ICD-10-CM

## 2021-02-08 LAB — SYNOVIAL CELL COUNT + DIFF, W/ CRYSTALS
Eosinophils-Synovial: 0 % (ref 0–1)
Lymphocytes-Synovial Fld: 1 % (ref 0–20)
Monocyte-Macrophage-Synovial Fluid: 18 % — ABNORMAL LOW (ref 50–90)
Neutrophil, Synovial: 81 % — ABNORMAL HIGH (ref 0–25)
WBC, Synovial: 8875 /mm3 — ABNORMAL HIGH (ref 0–200)

## 2021-02-08 LAB — CBG MONITORING, ED: Glucose-Capillary: 118 mg/dL — ABNORMAL HIGH (ref 70–99)

## 2021-02-08 MED ORDER — PREDNISONE 20 MG PO TABS
40.0000 mg | ORAL_TABLET | Freq: Every day | ORAL | 0 refills | Status: DC
  Filled 2021-02-08: qty 10, 5d supply, fill #0

## 2021-02-08 MED ORDER — ACETAMINOPHEN 500 MG PO TABS
1000.0000 mg | ORAL_TABLET | Freq: Four times a day (QID) | ORAL | 0 refills | Status: DC | PRN
  Filled 2021-02-08: qty 30, 4d supply, fill #0

## 2021-02-08 MED ORDER — LIDOCAINE HCL (PF) 1 % IJ SOLN
5.0000 mL | Freq: Once | INTRAMUSCULAR | Status: AC
  Administered 2021-02-08: 5 mL
  Filled 2021-02-08: qty 5

## 2021-02-08 NOTE — ED Triage Notes (Signed)
Pt presents with L leg pain he believes to be from a bug bite. He states he noticed yesterday am a mark on his thigh just above the knee and now he "can't hardly walk". Several very small scabbed areas noted, no redness. Edema to L knee. Pt very unsteady on his feet. Insisted to walk to room but RN placed pt in wheelchair due to fall concerns.

## 2021-02-08 NOTE — ED Notes (Signed)
Pt with very small abrasion noted above left knee. Swelling and tenderness to left knee.

## 2021-02-08 NOTE — ED Provider Notes (Signed)
[place] EMERGENCY DEPARTMENT Provider Note   CSN: [id number] Arrival date & time: 02/08/21  0543     History Chief Complaint  Patient presents with  . Leg Pain    Richard Osborne is a 87 y.o. male.  The history is provided by the patient and medical records.  Leg Pain  Richard Osborne is a 87 y.o. male who presents to the Emergency Department complaining of knee pain. He presents the emergency department complaining of pain to his left knee. He states that two days ago something bit his left thigh. He did not see an insect bites as he was lying in bed but he later noticed a red mark on his left thigh. Shortly thereafter he developed pain and swelling to his left knee. Pain is severe on ambulation. It is a mild to moderate throbbing at rest. No fevers, chest pain, shortness of breath. No prior similar symptoms. He does not take any blood thinners. No trauma.    Past Medical History:  Diagnosis Date  . High cholesterol   . Hypertension   . Meniere's disease   . Prostate cancer ([place])     There are no problems to display for this patient.   Past Surgical History:  Procedure Laterality Date  . otic nerve surgery    . PACEMAKER IMPLANT         No family history on file.  Social History   Tobacco Use  . Smoking status: Never Smoker  . Smokeless tobacco: Never Used  Substance Use Topics  . Alcohol use: No  . Drug use: No    Home Medications Prior to Admission medications   Medication Sig Start Date End Date Taking? Authorizing Provider  diphenhydrAMINE (BENADRYL) 25 MG tablet Take 1 tablet (25 mg total) by mouth every 6 (six) hours as needed for itching (Rash). 09/21/13   Piepenbrink, Anderson Malta, PA-C  predniSONE (DELTASONE) 20 MG tablet Take 2 tablets (40 mg total) by mouth daily. 09/21/13   Piepenbrink, Anderson Malta, PA-C  tamsulosin (FLOMAX) 0.4 MG CAPS capsule Take 0.4 mg by mouth.    [provider]    Allergies    Patient has no known  allergies.  Review of Systems   Review of Systems  All other systems reviewed and are negative.   Physical Exam Updated Vital Signs BP (!) 168/89 (BP Location: Right Arm)   Pulse 76   Temp 98.2 F (36.8 C) (Oral)   Resp 20   Ht 5' 10.5" (1.791 m)   Wt 79.4 kg   SpO2 97%   BMI 24.75 kg/m   Physical Exam Vitals and nursing note reviewed.  Constitutional:      Appearance: He is well-developed.  HENT:     Head: Normocephalic and atraumatic.  Cardiovascular:     Rate and Rhythm: Normal rate and regular rhythm.  Pulmonary:     Effort: Pulmonary effort is normal. No respiratory distress.  Musculoskeletal:     Comments: 2+ DP pulses bilaterally. There is moderate swelling to the left knee, warming comparison to the right knee. He does have pain on flexion of the left knee but is able to range the knee. There is a small superficial eschar to the left distal thigh with no significant surrounding erythema or induration.  Skin:    General: Skin is warm and dry.  Neurological:     Mental Status: He is alert and oriented to person, place, and time.  Psychiatric:  Behavior: Behavior normal.     ED Results / Procedures / Treatments   Labs (all labs ordered are listed, but only abnormal results are displayed) Labs Reviewed  CBG MONITORING, ED - Abnormal; Notable for the following components:      Result Value   Glucose-Capillary 118 (*)    All other components within normal limits  BODY FLUID CULTURE W GRAM STAIN  GRAM STAIN  SYNOVIAL CELL COUNT + DIFF, W/ CRYSTALS  GLUCOSE, BODY FLUID OTHER    EKG None  Radiology No results found.  Procedures .Joint Aspiration/Arthrocentesis  Date/Time: 02/08/2021 7:09 AM Performed by: Quintella Reichert, MD Authorized by: Quintella Reichert, MD   Consent:    Consent obtained:  Verbal   Consent given by:  Patient   Risks, benefits, and alternatives were discussed: yes     Risks discussed:  Bleeding, infection, pain and  incomplete drainage Universal protocol:    Patient identity confirmed:  Verbally with patient Location:    Location:  Knee   Knee:  L knee Anesthesia:    Anesthesia method:  Local infiltration   Local anesthetic:  Lidocaine 1% w/o epi Procedure details:    Preparation: Patient was prepped and draped in usual sterile fashion     Needle gauge:  18 G   Ultrasound guidance: no     Approach:  Medial   Aspirate amount:  20   Aspirate characteristics:  Yellow   Steroid injected: no     Specimen collected: yes   Post-procedure details:    Dressing:  Adhesive bandage   Procedure completion:  Tolerated     Medications Ordered in ED Medications  lidocaine (PF) (XYLOCAINE) 1 % injection 5 mL (5 mLs Other Given 02/08/21 9937)    ED Course  I have reviewed the triage vital signs and the nursing notes.  Pertinent labs & imaging results that were available during my care of the patient were reviewed by me and considered in my medical decision making (see chart for details).    MDM Rules/Calculators/A&P                         patient here for evaluation of left knee swelling. He is concerned about potential insect bite. He does have a small scalp to the left distal thigh that does not appear to have local infection or inflammatory changes. He does have warmth to the left knee with palpable effusion. He does have decreased range of motion to the left knee. Discussed with patient recommendation for arthrodesis to further evaluate source of his symptoms. Procedure performed per note. Patient care transferred pending analysis of synovial fluid. Final Clinical Impression(s) / ED Diagnoses Final diagnoses:  Knee effusion, left    Rx / DC Orders ED Discharge Orders    None       Quintella Reichert, MD 02/08/21 [phone]

## 2021-02-08 NOTE — ED Notes (Signed)
Report from Rebecca, RN

## 2021-02-08 NOTE — ED Notes (Signed)
Pt would like to hold off on ace wrap until dispo is decided

## 2021-02-08 NOTE — ED Provider Notes (Signed)
I received this patient in signout from Dr. Ralene Bathe.  Briefly, he had presented with pain and swelling of his left knee, plain films negative.  Arthrocentesis had been performed to evaluate for septic joint.  At time of signout, awaiting synovial fluid analysis.  Fluid analysis shows 8875 WBCs, 80% neutrophils, Gram staining showing WBCs with no organisms.  Calcium pyrophosphate crystals present.  This picture is consistent with pseudogout.  Patient denies history of the same.  Because I have no kidney function on the patient and because he denies any history of diabetes, will start on prednisone burst and have patient follow closely with PCP for reassessment.  I have discussed supportive measures for symptoms.  I extensively reviewed return precautions and he voiced understanding.   Jalena Vanderlinden, Wenda Overland, MD 02/08/21 [phone]

## 2021-02-09 LAB — GLUCOSE, BODY FLUID OTHER: Glucose, Body Fluid Other: 78 mg/dL

## 2021-02-11 LAB — BODY FLUID CULTURE W GRAM STAIN: Culture: NO GROWTH

## 2021-02-16 ENCOUNTER — Encounter (HOSPITAL_BASED_OUTPATIENT_CLINIC_OR_DEPARTMENT_OTHER): Payer: Self-pay | Admitting: Emergency Medicine

## 2021-02-16 ENCOUNTER — Other Ambulatory Visit: Payer: Self-pay

## 2021-02-16 ENCOUNTER — Emergency Department (HOSPITAL_BASED_OUTPATIENT_CLINIC_OR_DEPARTMENT_OTHER)
Admission: EM | Admit: 2021-02-16 | Discharge: 2021-02-16 | Disposition: A | Discharge status: Home / Self Care | Payer: Medicare Other | Attending: Emergency Medicine | Admitting: Emergency Medicine

## 2021-02-16 DIAGNOSIS — I10 Essential (primary) hypertension: Secondary | ICD-10-CM | POA: Insufficient documentation

## 2021-02-16 DIAGNOSIS — Z8546 Personal history of malignant neoplasm of prostate: Secondary | ICD-10-CM | POA: Insufficient documentation

## 2021-02-16 DIAGNOSIS — Z950 Presence of cardiac pacemaker: Secondary | ICD-10-CM | POA: Diagnosis not present

## 2021-02-16 DIAGNOSIS — B029 Zoster without complications: Secondary | ICD-10-CM | POA: Insufficient documentation

## 2021-02-16 DIAGNOSIS — R21 Rash and other nonspecific skin eruption: Secondary | ICD-10-CM | POA: Diagnosis present

## 2021-02-16 DIAGNOSIS — Z79899 Other long term (current) drug therapy: Secondary | ICD-10-CM | POA: Insufficient documentation

## 2021-02-16 MED ORDER — VALACYCLOVIR HCL 1 G PO TABS: 1000.0000 mg | ORAL_TABLET | Freq: Three times a day (TID) | ORAL | 0 refills | Status: DC

## 2021-02-16 NOTE — ED Provider Notes (Signed)
[facility] EMERGENCY DEPARTMENT Provider Note   CSN: [id number] Arrival date & time: 02/16/21  1027     History No chief complaint on file.   Richard Osborne is a 87 y.o. male.  87 yo M with a chief complaint of a rash to his left buttock.  This is been noticed for a couple days now.  Has heard him off and on.  He looked at it with a magnifying glass and felt like it looked ugly and thought he had to come into the ED for evaluation.  No fevers or chills.  No break in the skin.  Patient was just in the emergency department for a gout flare.  He was started on therapy for that.  The history is provided by the patient.  Illness Severity:  Moderate Onset quality:  Gradual Duration:  2 hours Timing:  Constant Progression:  Worsening Chronicity:  New Associated symptoms: rash   Associated symptoms: no abdominal pain, no chest pain, no congestion, no diarrhea, no fever, no headaches, no myalgias, no shortness of breath and no vomiting        Past Medical History:  Diagnosis Date  . High cholesterol   . Hypertension   . Meniere's disease   . Prostate cancer ([place])     There are no problems to display for this patient.   Past Surgical History:  Procedure Laterality Date  . otic nerve surgery    . PACEMAKER IMPLANT         History reviewed. No pertinent family history.  Social History   Tobacco Use  . Smoking status: Never Smoker  . Smokeless tobacco: Never Used  Substance Use Topics  . Alcohol use: No  . Drug use: No    Home Medications Prior to Admission medications   Medication Sig Start Date End Date Taking? Authorizing Provider  valACYclovir (VALTREX) 1000 MG tablet Take 1 tablet (1,000 mg total) by mouth 3 (three) times daily. 02/16/21  Yes Deno Etienne, DO  acetaminophen (TYLENOL) 500 MG tablet Take 2 tablets (1,000 mg total) by mouth every 6 (six) hours as needed for moderate pain. 02/08/21   Little, Wenda Overland, MD  diphenhydrAMINE (BENADRYL) 25  MG tablet Take 1 tablet (25 mg total) by mouth every 6 (six) hours as needed for itching (Rash). 09/21/13   Piepenbrink, Anderson Malta, PA-C  predniSONE (DELTASONE) 20 MG tablet Take 2 tablets (40 mg total) by mouth daily. 02/08/21   Little, Wenda Overland, MD  tamsulosin (FLOMAX) 0.4 MG CAPS capsule Take 0.4 mg by mouth.    [provider]    Allergies    Patient has no known allergies.  Review of Systems   Review of Systems  Constitutional: Negative for chills and fever.  HENT: Negative for congestion and facial swelling.   Eyes: Negative for discharge and visual disturbance.  Respiratory: Negative for shortness of breath.   Cardiovascular: Negative for chest pain and palpitations.  Gastrointestinal: Negative for abdominal pain, diarrhea and vomiting.  Musculoskeletal: Negative for arthralgias and myalgias.  Skin: Positive for rash. Negative for color change.  Neurological: Negative for tremors, syncope and headaches.  Psychiatric/Behavioral: Negative for confusion and dysphoric mood.    Physical Exam Updated Vital Signs BP (!) 156/84 (BP Location: Right Arm)   Pulse 82   Temp 98.9 F (37.2 C) (Oral)   Resp 18   Ht 5\' 10"  (1.778 m)   Wt 79.4 kg   SpO2 100%   BMI 25.11 kg/m   Physical  Exam Vitals and nursing note reviewed.  Constitutional:      Appearance: He is well-developed.  HENT:     Head: Normocephalic and atraumatic.  Eyes:     Pupils: Pupils are equal, round, and reactive to light.  Neck:     Vascular: No JVD.  Cardiovascular:     Rate and Rhythm: Normal rate and regular rhythm.     Heart sounds: No murmur heard. No friction rub. No gallop.   Pulmonary:     Effort: No respiratory distress.     Breath sounds: No wheezing.  Abdominal:     General: There is no distension.     Tenderness: There is no abdominal tenderness. There is no guarding or rebound.  Musculoskeletal:        General: Normal range of motion.     Cervical back: Normal range of motion  and neck supple.     Comments: Vesicular rash on the left buttock.  Skin:    Coloration: Skin is not pale.     Findings: No rash.  Neurological:     Mental Status: He is alert and oriented to person, place, and time.  Psychiatric:        Behavior: Behavior normal.     ED Results / Procedures / Treatments   Labs (all labs ordered are listed, but only abnormal results are displayed) Labs Reviewed - No data to display  EKG None  Radiology No results found.  Procedures Procedures   Medications Ordered in ED Medications - No data to display  ED Course  I have reviewed the triage vital signs and the nursing notes.  Pertinent labs & imaging results that were available during my care of the patient were reviewed by me and considered in my medical decision making (see chart for details).    MDM Rules/Calculators/A&P                          87 yo M with a chief complaints of a rash to his left buttock.  Noticed for a couple days now.  He thought it was from something he sat in when he was in the hospital to be evaluated for a gout flare.  Clinically appears to be shingles.  Was just on steroids so we will hold off on steroid therapy.  We will start him on antivirals.  PCP follow-up.  10:25 AM:  I have discussed the diagnosis/risks/treatment options with the patient and believe the pt to be eligible for discharge home to follow-up with PCP. We also discussed returning to the ED immediately if new or worsening sx occur. We discussed the sx which are most concerning (e.g., sudden worsening pain, fever, inability to tolerate by mouth) that necessitate immediate return. Medications administered to the patient during their visit and any new prescriptions provided to the patient are listed below.  Medications given during this visit Medications - No data to display   The patient appears reasonably screen and/or stabilized for discharge and I doubt any other medical condition or other  [facility] requiring further screening, evaluation, or treatment in the ED at this time prior to discharge.   Final Clinical Impression(s) / ED Diagnoses Final diagnoses:  Herpes zoster without complication    Rx / DC Orders ED Discharge Orders         Ordered    valACYclovir (VALTREX) 1000 MG tablet  3 times daily        02/16/21 1010  Deno Etienne, DO 02/16/21 1025

## 2021-02-16 NOTE — Discharge Instructions (Signed)
This usually does not cause you much significant issue however it can cause someone with immune depression or someone that is pregnant to have a severe condition.  Please try to stay away from people like that until you are no longer having blisters on your rash.  The antiviral medicine can help you with your symptoms.  It looks like you are just on steroids for your gout flare.  We will hold off give any steroids at this time.

## 2021-02-16 NOTE — ED Triage Notes (Signed)
Patient complaining of "breaking out" 5/4 or 5/5 around his "exit". Clarified that patient is referring to anus. Unknown origin. Has used rubbing alcohol on the area without change.

## 2021-03-11 ENCOUNTER — Other Ambulatory Visit: Payer: Self-pay

## 2021-05-05 ENCOUNTER — Other Ambulatory Visit: Payer: Self-pay

## 2021-05-30 ENCOUNTER — Emergency Department (HOSPITAL_BASED_OUTPATIENT_CLINIC_OR_DEPARTMENT_OTHER)
Admission: EM | Admit: 2021-05-30 | Discharge: 2021-05-30 | Disposition: A | Discharge status: Home / Self Care | Payer: Medicare Other | Attending: Emergency Medicine | Admitting: Emergency Medicine

## 2021-05-30 ENCOUNTER — Encounter (HOSPITAL_BASED_OUTPATIENT_CLINIC_OR_DEPARTMENT_OTHER): Payer: Self-pay | Admitting: Emergency Medicine

## 2021-05-30 ENCOUNTER — Other Ambulatory Visit: Payer: Self-pay

## 2021-05-30 DIAGNOSIS — Z79899 Other long term (current) drug therapy: Secondary | ICD-10-CM | POA: Diagnosis not present

## 2021-05-30 DIAGNOSIS — Z8546 Personal history of malignant neoplasm of prostate: Secondary | ICD-10-CM | POA: Diagnosis not present

## 2021-05-30 DIAGNOSIS — M25562 Pain in left knee: Secondary | ICD-10-CM | POA: Diagnosis not present

## 2021-05-30 DIAGNOSIS — I10 Essential (primary) hypertension: Secondary | ICD-10-CM | POA: Insufficient documentation

## 2021-05-30 MED ORDER — ACETAMINOPHEN 325 MG PO TABS
650.0000 mg | ORAL_TABLET | Freq: Once | ORAL | Status: AC
  Administered 2021-05-30: 650 mg via ORAL
  Filled 2021-05-30: qty 2

## 2021-05-30 NOTE — ED Triage Notes (Signed)
Pt reports pain and swelling to LT knee x 2 wks; is concerned for spider bite

## 2021-05-30 NOTE — ED Provider Notes (Signed)
[facility] EMERGENCY DEPARTMENT Provider Note   CSN: [id number] Arrival date & time: 05/30/21  1348     History Chief Complaint  Patient presents with   Knee Pain    Richard Osborne is a 87 y.o. male.  Patient presents to the emergency department for evaluation of left knee pain.  He has a history of pseudogout diagnosed on a joint aspiration earlier this year.  Patient reports knee pain, laterally, over the past 2 weeks.  He has noted a small dark spot below the knee, several days ago.  He has noted a spider on him but is not sure if he has been bitten.  He states that he had an evaluation recently and had an x-ray.  He does not know what the x-ray showed.  He states that the provider told him this was gout, however he is certain that this is not gout.  He has been taking arthritis strength Tylenol without improvement.  No fevers, vomiting.  He is able to walk.      Past Medical History:  Diagnosis Date   High cholesterol    Hypertension    Meniere's disease    Prostate cancer ([place])     There are no problems to display for this patient.   Past Surgical History:  Procedure Laterality Date   otic nerve surgery     PACEMAKER IMPLANT         No family history on file.  Social History   Tobacco Use   Smoking status: Never   Smokeless tobacco: Never  Substance Use Topics   Alcohol use: No   Drug use: No    Home Medications Prior to Admission medications   Medication Sig Start Date End Date Taking? Authorizing Provider  acetaminophen (TYLENOL) 500 MG tablet Take 2 tablets (1,000 mg total) by mouth every 6 (six) hours as needed for moderate pain. 02/08/21   Little, Wenda Overland, MD  diphenhydrAMINE (BENADRYL) 25 MG tablet Take 1 tablet (25 mg total) by mouth every 6 (six) hours as needed for itching (Rash). 09/21/13   Piepenbrink, Anderson Malta, PA-C  predniSONE (DELTASONE) 20 MG tablet Take 2 tablets (40 mg total) by mouth daily. 02/08/21   Little, Wenda Overland,  MD  tamsulosin (FLOMAX) 0.4 MG CAPS capsule Take 0.4 mg by mouth.    [provider]  valACYclovir (VALTREX) 1000 MG tablet Take 1 tablet (1,000 mg total) by mouth 3 (three) times daily. 02/16/21   Deno Etienne, DO    Allergies    Patient has no known allergies.  Review of Systems   Review of Systems  Constitutional:  Negative for activity change.  Musculoskeletal:  Positive for arthralgias and joint swelling. Negative for back pain, gait problem and neck pain.  Skin:  Negative for wound.  Neurological:  Negative for weakness and numbness.   Physical Exam Updated Vital Signs BP (!) 143/95 (BP Location: Right Arm)   Pulse 90   Temp 98.4 F (36.9 C) (Oral)   Resp 18   Ht '5\' 10"'$  (1.778 m)   Wt 78 kg   SpO2 97%   BMI 24.68 kg/m   Physical Exam Vitals and nursing note reviewed.  Constitutional:      Appearance: He is well-developed.  HENT:     Head: Normocephalic and atraumatic.  Eyes:     Conjunctiva/sclera: Conjunctivae normal.  Cardiovascular:     Pulses: Normal pulses. No decreased pulses.  Musculoskeletal:        General: Tenderness  present.     Cervical back: Normal range of motion and neck supple.     Right lower leg: No edema.     Left lower leg: No edema.     Comments: Left knee, full active range of motion.  No joint effusion or swelling.  There is a small hyperpigmented macule over the proximal tibia anteriorly.  No signs of cellulitis or infection.  Patient has tenderness over the lateral joint line.  Skin:    General: Skin is warm and dry.  Neurological:     Mental Status: He is alert.     Sensory: No sensory deficit.     Comments: Motor, sensation, and vascular distal to the injury is fully intact.   Psychiatric:        Mood and Affect: Mood normal.    ED Results / Procedures / Treatments   Labs (all labs ordered are listed, but only abnormal results are displayed) Labs Reviewed - No data to display  EKG None  Radiology No results  found.  Procedures Procedures   Medications Ordered in ED Medications  acetaminophen (TYLENOL) tablet 650 mg (has no administration in time range)    ED Course  I have reviewed the triage vital signs and the nursing notes.  Pertinent labs & imaging results that were available during my care of the patient were reviewed by me and considered in my medical decision making (see chart for details).  Patient seen and examined.   No signs of inflammatory arthritis today.  Suspect osteoarthritis given prescription.  No effusion.  No signs of infection.  Will give Ace wrap.  Encouraged continued Tylenol use.  Sports medicine referral given.  Patient is a bit fixated about pain being from a spider bite especially with the dark spot that came up on his skin.  He is worried about losing his leg, as happened to a friend.  I tried to reassure him that I do not see any signs of a concerning skin reaction today and certainly no signs of skin infection.  Vital signs reviewed and are as follows: BP (!) 143/95 (BP Location: Right Arm)   Pulse 90   Temp 98.4 F (36.9 C) (Oral)   Resp 18   Ht '5\' 10"'$  (1.778 m)   Wt 78 kg   SpO2 97%   BMI 24.68 kg/m      MDM Rules/Calculators/A&P                           Knee pain, suspect acute on chronic.  No signs of joint infection or inflammatory arthritis today.    Final Clinical Impression(s) / ED Diagnoses Final diagnoses:  Acute pain of left knee    Rx / DC Orders ED Discharge Orders     None        Carlisle Cater, PA-C 05/30/21 1533    Lucrezia Starch, MD 05/30/21 [phone]

## 2021-05-30 NOTE — ED Notes (Signed)
Discharge instructions discussed with pt. Pt verbalized understanding. Pt stable and ambulatory.  °

## 2021-05-30 NOTE — Discharge Instructions (Signed)
Please read and follow all provided instructions.  Your diagnoses today include:  1. Acute pain of left knee     Tests performed today include: Vital signs. See below for your results today.   Medications prescribed:  Take over-the-counter tylenol as directed on the packaging for pain  Take any prescribed medications only as directed.  Home care instructions:  Follow any educational materials contained in this packet Follow R.I.C.E. Protocol: R - rest your injury  I  - use ice on injury without applying directly to skin C - compress injury with bandage or splint E - elevate the injury as much as possible  Follow-up instructions: Please follow-up with the provided sports medicine physician (bone specialist) listed.   Return instructions:  Please return if your toes or feet are numb or tingling, appear gray or blue, or you have severe pain (also elevate the leg and loosen splint or wrap if you were given one) Please return to the Emergency Department if you experience worsening symptoms.  Please return if you have any other emergent concerns.  Additional Information:  Your vital signs today were: BP (!) 143/95 (BP Location: Right Arm)   Pulse 90   Temp 98.4 F (36.9 C) (Oral)   Resp 18   Ht '5\' 10"'$  (1.778 m)   Wt 78 kg   SpO2 97%   BMI 24.68 kg/m  If your blood pressure (BP) was elevated above 135/85 this visit, please have this repeated by your doctor within one month. --------------

## 2021-06-01 ENCOUNTER — Ambulatory Visit: Payer: Self-pay

## 2021-06-01 ENCOUNTER — Encounter: Payer: Self-pay | Admitting: Family Medicine

## 2021-06-01 ENCOUNTER — Other Ambulatory Visit: Payer: Self-pay

## 2021-06-01 ENCOUNTER — Ambulatory Visit: Payer: Medicare Other | Admitting: Family Medicine

## 2021-06-01 VITALS — Ht 70.0 in | Wt 172.0 lb

## 2021-06-01 DIAGNOSIS — M11262 Other chondrocalcinosis, left knee: Secondary | ICD-10-CM | POA: Diagnosis not present

## 2021-06-01 MED ORDER — DICLOFENAC SODIUM 1 % EX GEL: 4.0000 g | Freq: Four times a day (QID) | CUTANEOUS | 11 refills | Status: DC

## 2021-06-01 MED ORDER — TRIAMCINOLONE ACETONIDE 40 MG/ML IJ SUSP
40.0000 mg | Freq: Once | INTRAMUSCULAR | Status: AC
  Administered 2021-06-01: 40 mg via INTRA_ARTICULAR

## 2021-06-01 NOTE — Patient Instructions (Signed)
Nice to meet you Please try ice  Please try the exercises   Please send me a message in MyChart with any questions or updates.  Please see me back in 4 weeks.   --Dr. Sonyia Muro  

## 2021-06-01 NOTE — Assessment & Plan Note (Signed)
Previous aspiration and May was showing pseudogout.  Does have calcifications and joint space narrowing in the lateral compartment. -Counseled on home exercise therapy and supportive care. -Injection today. - voltaren  -Could consider gel injection or physical therapy

## 2021-06-01 NOTE — Addendum Note (Signed)
Addended by: Cresenciano Lick on: 06/01/2021 04:25 PM   Modules accepted: Orders

## 2021-06-01 NOTE — Progress Notes (Signed)
  Richard Osborne - 87 y.o. male MRN [id number]  Date of birth: 08-04-1934  SUBJECTIVE:  Including CC & ROS.  No chief complaint on file.   Richard Osborne is a 87 y.o. male that is presenting with acute on chronic left knee pain.  The pain is occurring over the lateral compartment.  No injury or inciting event.  No redness or swelling.  Does have a history of pseudogout..  Independent review of the left knee x-ray from 5/2 shows chondrocalcinosis of the bipartite patella   Review of Systems See HPI   HISTORY: Past Medical, Surgical, Social, and Family History Reviewed & Updated per EMR.   Pertinent Historical Findings include:  Past Medical History:  Diagnosis Date   High cholesterol    Hypertension    Meniere's disease    Prostate cancer [facility])     Past Surgical History:  Procedure Laterality Date   otic nerve surgery     PACEMAKER IMPLANT      History reviewed. No pertinent family history.  Social History   Socioeconomic History   Marital status: Divorced    Spouse name: Not on file   Number of children: Not on file   Years of education: Not on file   Highest education level: Not on file  Occupational History   Not on file  Tobacco Use   Smoking status: Never   Smokeless tobacco: Never  Substance and Sexual Activity   Alcohol use: No   Drug use: No   Sexual activity: Not on file  Other Topics Concern   Not on file  Social History Narrative   Not on file   Social Determinants of Health   Financial Resource Strain: Not on file  Food Insecurity: Not on file  Transportation Needs: Not on file  Physical Activity: Not on file  Stress: Not on file  Social Connections: Not on file  Intimate Partner Violence: Not on file     PHYSICAL EXAM:  VS: Ht '5\' 10"'$  (1.778 m)   Wt 172 lb (78 kg)   BMI 24.68 kg/m  Physical Exam Gen: NAD, alert, cooperative with exam, well-appearing MSK:  Left knee: Normal range of motion. No redness or swelling. Neurovascular  intact   Aspiration/Injection Procedure Note Richard Osborne 05/19/1934  Procedure: Injection Indications: Left knee pain  Procedure Details Consent: Risks of procedure as well as the alternatives and risks of each were explained to the (patient/caregiver).  Consent for procedure obtained. Time Out: Verified patient identification, verified procedure, site/side was marked, verified correct patient position, special equipment/implants available, medications/allergies/relevent history reviewed, required imaging and test results available.  Performed.  The area was cleaned with iodine and alcohol swabs.    The left knee superior lateral suprapatellar pouch was injected using 3 cc of 1% lidocaine on a 25-gauge 1-1/2 inch needle.  The syringe was switched to mixture containing 1 cc's of 40 mg Kenalog and 4 cc's of 0.25% bupivacaine was injected.  Ultrasound was used. Images were obtained in long views showing the injection.     A sterile dressing was applied.  Patient did tolerate procedure well.      ASSESSMENT & PLAN:   Pseudogout of left knee Previous aspiration and May was showing pseudogout.  Does have calcifications and joint space narrowing in the lateral compartment. -Counseled on home exercise therapy and supportive care. -Injection today. - voltaren  -Could consider gel injection or physical therapy

## 2021-06-20 IMAGING — CR DG KNEE COMPLETE 4+V*L*
4 series · 4 of 4 positions shown · non-contrast
Comparison: Tibia and fibula from 4645

CLINICAL DATA: LEFT leg pain from bug bite.  Just above the knee.

EXAM:
LEFT KNEE - COMPLETE 4+ VIEW

[t knee ap left]
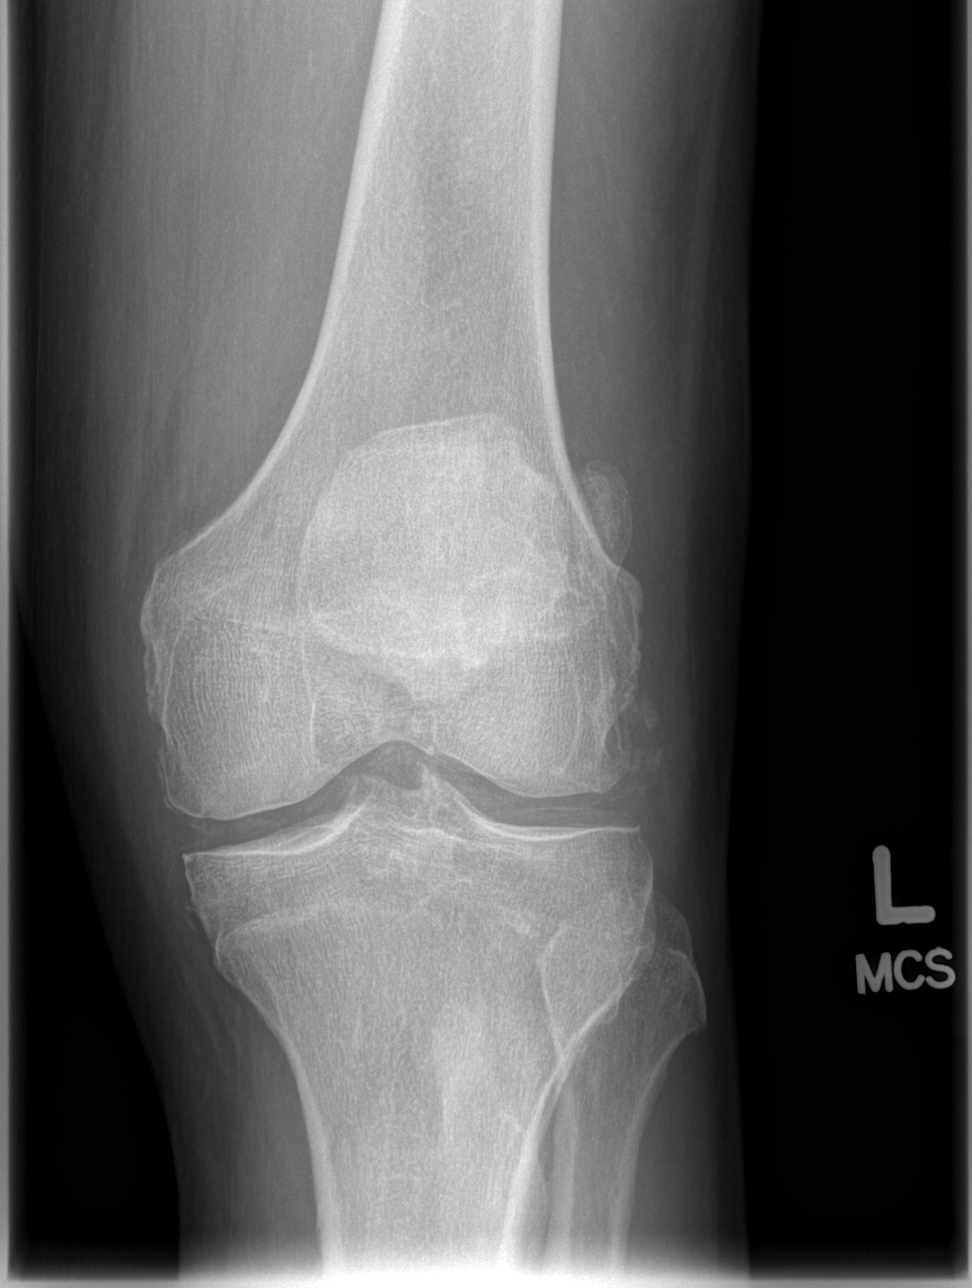

[t knee oblique left (1 of 2)]
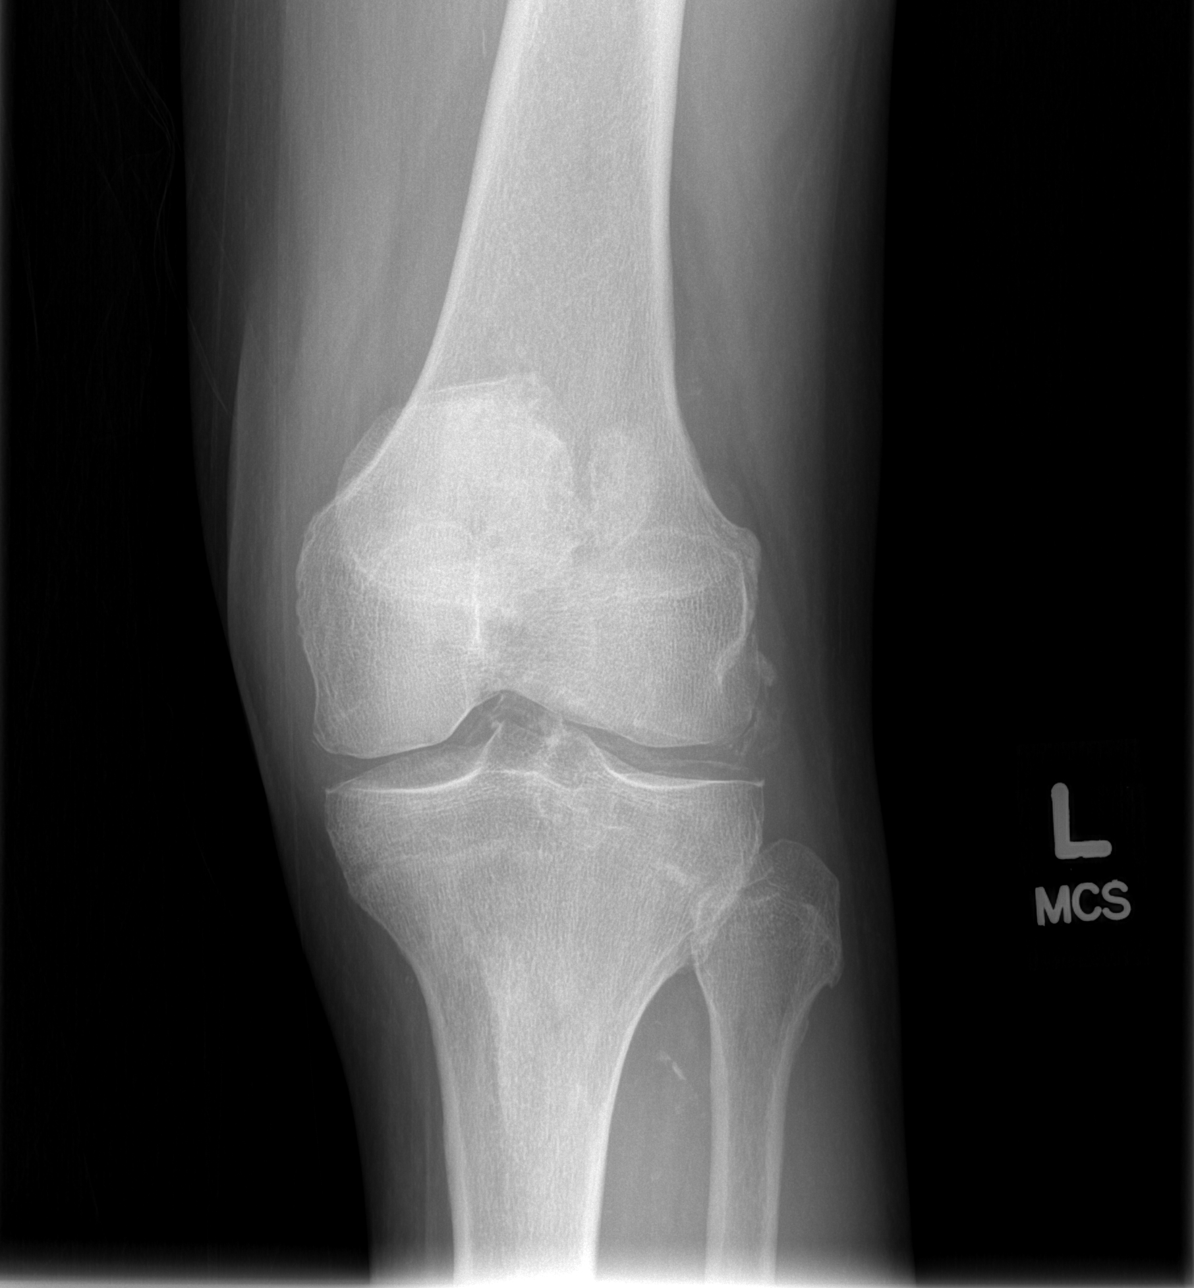

[t knee oblique left (2 of 2)]
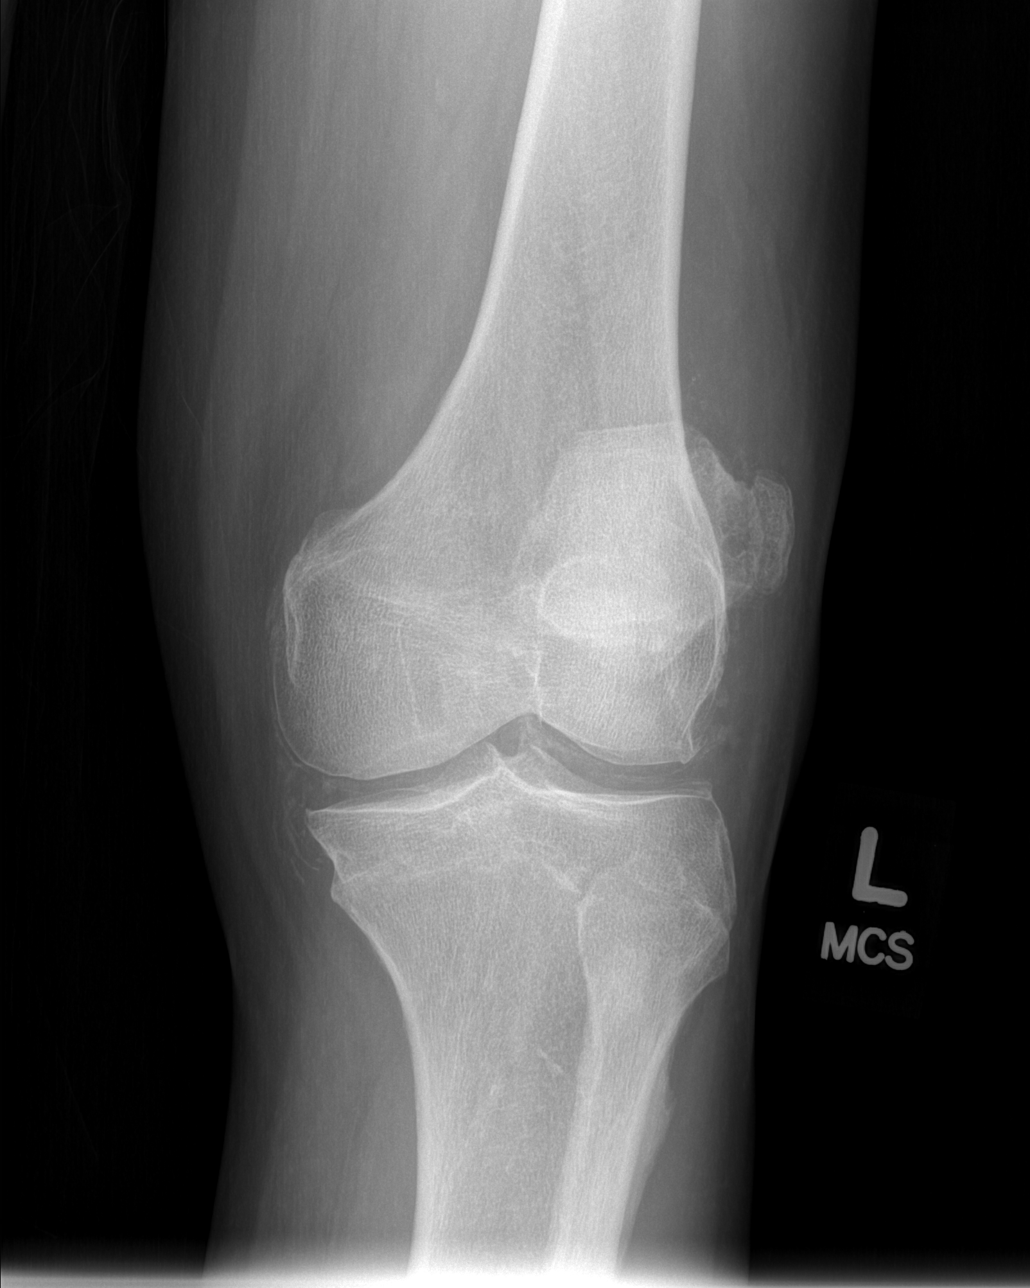

[t knee lat left]
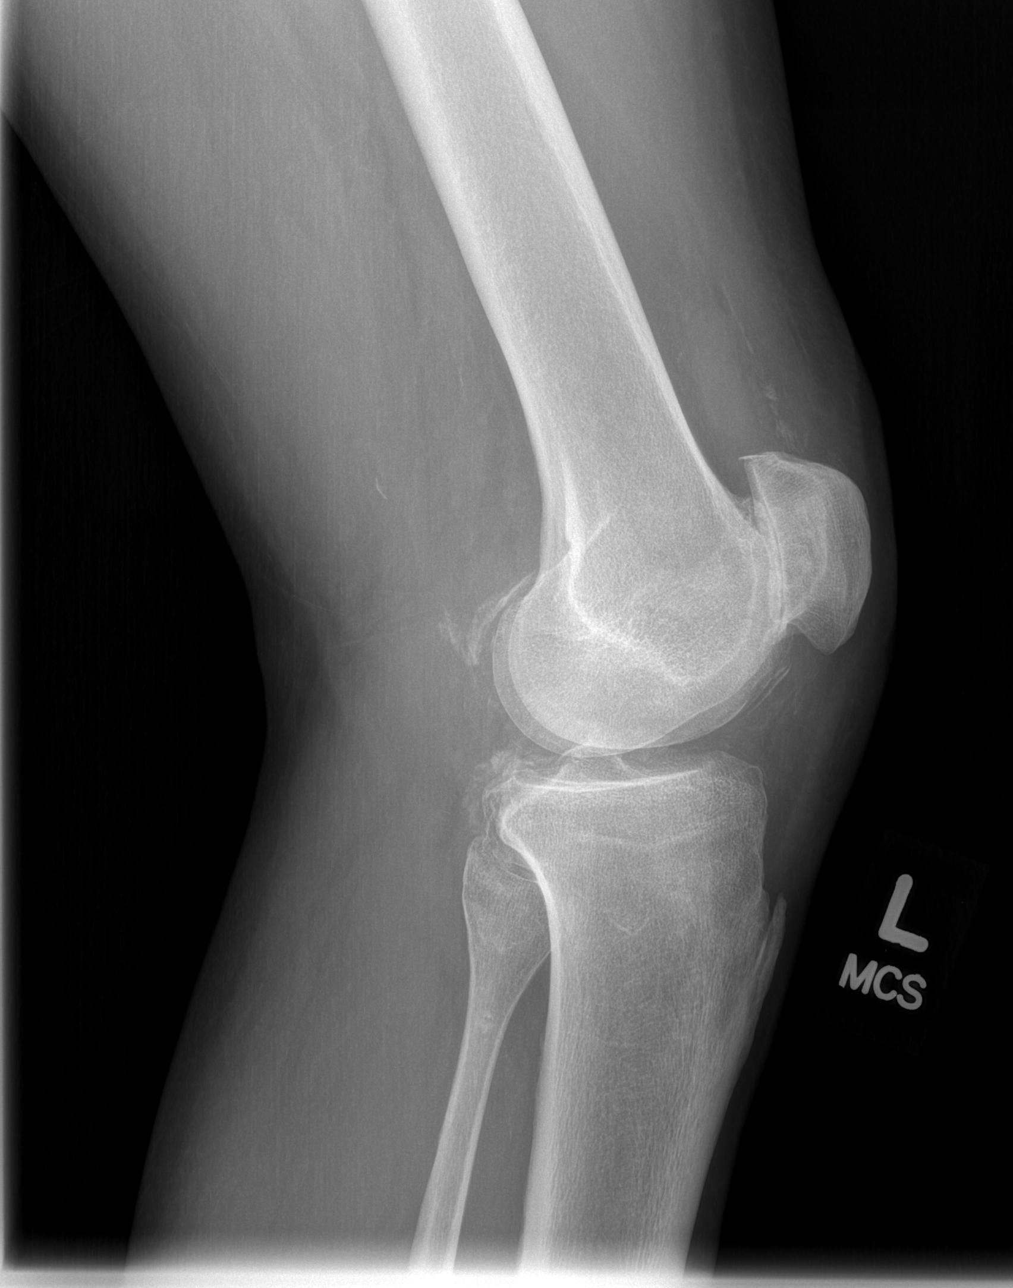

[4 of 4 positions shown; findings below may reference images not displayed]

FINDINGS: Degenerative changes are noted about the knee, tricompartmental
degenerative changes.

No foreign body in the soft tissues. Soft tissue swelling, minimal
up above the patella. Well corticated bony fragment lateral to the
patella likely multipartite patella.

Chondrocalcinosis.

No sign of acute fracture or subluxation.
IMPRESSION: 1. No acute bony abnormality.
2. Soft tissue swelling, minimal up above the patella.
3. Degenerative changes, moderate to marked with chondrocalcinosis.
4. Suspected multipartite patella versus is sequela of prior trauma.

## 2021-06-28 ENCOUNTER — Ambulatory Visit: Payer: Medicare Other | Admitting: Family Medicine

## 2023-01-24 ENCOUNTER — Encounter: Payer: Self-pay | Admitting: *Deleted

## 2023-03-11 ENCOUNTER — Encounter (HOSPITAL_BASED_OUTPATIENT_CLINIC_OR_DEPARTMENT_OTHER): Payer: Self-pay | Admitting: Emergency Medicine

## 2023-03-11 ENCOUNTER — Other Ambulatory Visit: Payer: Self-pay

## 2023-03-11 ENCOUNTER — Emergency Department (HOSPITAL_BASED_OUTPATIENT_CLINIC_OR_DEPARTMENT_OTHER)
Admission: EM | Admit: 2023-03-11 | Discharge: 2023-03-11 | Disposition: A | Discharge status: Home / Self Care | Payer: Medicare Other | Attending: Emergency Medicine | Admitting: Emergency Medicine

## 2023-03-11 ENCOUNTER — Emergency Department (HOSPITAL_BASED_OUTPATIENT_CLINIC_OR_DEPARTMENT_OTHER): Payer: Medicare Other

## 2023-03-11 DIAGNOSIS — R195 Other fecal abnormalities: Secondary | ICD-10-CM | POA: Diagnosis not present

## 2023-03-11 DIAGNOSIS — Z8546 Personal history of malignant neoplasm of prostate: Secondary | ICD-10-CM | POA: Diagnosis not present

## 2023-03-11 DIAGNOSIS — Z950 Presence of cardiac pacemaker: Secondary | ICD-10-CM | POA: Insufficient documentation

## 2023-03-11 DIAGNOSIS — R109 Unspecified abdominal pain: Secondary | ICD-10-CM | POA: Diagnosis present

## 2023-03-11 DIAGNOSIS — I10 Essential (primary) hypertension: Secondary | ICD-10-CM | POA: Insufficient documentation

## 2023-03-11 LAB — URINALYSIS, ROUTINE W REFLEX MICROSCOPIC
Bilirubin Urine: NEGATIVE
Glucose, UA: NEGATIVE mg/dL
Hgb urine dipstick: NEGATIVE
Ketones, ur: NEGATIVE mg/dL
Leukocytes,Ua: NEGATIVE
Nitrite: NEGATIVE
Protein, ur: NEGATIVE mg/dL
Specific Gravity, Urine: 1.015 (ref 1.005–1.030)
pH: 5.5 (ref 5.0–8.0)

## 2023-03-11 LAB — CBC WITH DIFFERENTIAL/PLATELET
Abs Immature Granulocytes: 0.01 10*3/uL (ref 0.00–0.07)
Basophils Absolute: 0 10*3/uL (ref 0.0–0.1)
Basophils Relative: 0 %
Eosinophils Absolute: 0.1 10*3/uL (ref 0.0–0.5)
Eosinophils Relative: 1 %
HCT: 41 % (ref 39.0–52.0)
Hemoglobin: 13.2 g/dL (ref 13.0–17.0)
Immature Granulocytes: 0 %
Lymphocytes Relative: 20 %
Lymphs Abs: 1.5 10*3/uL (ref 0.7–4.0)
MCH: 29.7 pg (ref 26.0–34.0)
MCHC: 32.2 g/dL (ref 30.0–36.0)
MCV: 92.1 fL (ref 80.0–100.0)
Monocytes Absolute: 0.8 10*3/uL (ref 0.1–1.0)
Monocytes Relative: 11 %
Neutro Abs: 5 10*3/uL (ref 1.7–7.7)
Neutrophils Relative %: 68 %
Platelets: 221 10*3/uL (ref 150–400)
RBC: 4.45 MIL/uL (ref 4.22–5.81)
RDW: 12.6 % (ref 11.5–15.5)
WBC: 7.3 10*3/uL (ref 4.0–10.5)
nRBC: 0 % (ref 0.0–0.2)

## 2023-03-11 LAB — COMPREHENSIVE METABOLIC PANEL
ALT: 19 U/L (ref 0–44)
AST: 33 U/L (ref 15–41)
Albumin: 4.4 g/dL (ref 3.5–5.0)
Alkaline Phosphatase: 61 U/L (ref 38–126)
Anion gap: 8 (ref 5–15)
BUN: 21 mg/dL (ref 8–23)
CO2: 23 mmol/L (ref 22–32)
Calcium: 9.2 mg/dL (ref 8.9–10.3)
Chloride: 108 mmol/L (ref 98–111)
Creatinine, Ser: 1.44 mg/dL — ABNORMAL HIGH (ref 0.61–1.24)
GFR, Estimated: 47 mL/min — ABNORMAL LOW (ref >60)
Glucose, Bld: 107 mg/dL — ABNORMAL HIGH (ref 70–99)
Potassium: 3.9 mmol/L (ref 3.5–5.1)
Sodium: 139 mmol/L (ref 135–145)
Total Bilirubin: 0.8 mg/dL (ref 0.3–1.2)
Total Protein: 7.9 g/dL (ref 6.5–8.1)

## 2023-03-11 LAB — OCCULT BLOOD X 1 CARD TO LAB, STOOL: Fecal Occult Bld: NEGATIVE

## 2023-03-11 LAB — LIPASE, BLOOD: Lipase: 259 U/L — ABNORMAL HIGH (ref 11–51)

## 2023-03-11 MED ORDER — IOHEXOL 300 MG/ML  SOLN
60.0000 mL | Freq: Once | INTRAMUSCULAR | Status: AC | PRN
  Administered 2023-03-11: 80 mL via INTRAVENOUS

## 2023-03-11 MED ORDER — SODIUM CHLORIDE 0.9 % IV BOLUS
1000.0000 mL | Freq: Once | INTRAVENOUS | Status: AC
  Administered 2023-03-11: 1000 mL via INTRAVENOUS

## 2023-03-11 NOTE — ED Provider Notes (Signed)
[facility] DE[facility] Provider Note   CSN: [id number] Arrival date & time: 03/11/23  6213     History  Chief Complaint  Patient presents with   Abdominal Pain    Richard Osborne is a 89 y.o. male.  The history is provided by the patient and medical records. No language interpreter was used.  Abdominal Pain    89 year old male with a history of prostate cancer, hypertension, Mnire's disease, has pacemaker implant, who presents with complaints of abdominal pain.  Patient report a week and a half ago he was at [employer].  States that he was eating catfish and may have swallowed a small fishbone.  Denies any significant discomfort from that incident.  However several days later he endorsed feeling a bit constipated.  States he just has not had a good bowel movement.  In the past several days he has tried different types of laxative including Pepto-Bismol and enema and this morning when he had a bowel movement he noticed that his stools black which concerns him.  He endorsed minimal abdominal discomfort and denies any lightheadedness or dizziness no fever or chills no chest pain or shortness of breath.  He is not on any blood thinner medication.  He does not take iron supplementation.  His last colonoscopy was more than 10 years ago.  Home Medications Prior to Admission medications   Medication Sig Start Date End Date Taking? Authorizing Provider  acetaminophen (TYLENOL) 500 MG tablet Take 2 tablets (1,000 mg total) by mouth every 6 (six) hours as needed for moderate pain. 02/08/21   Little, Ambrose Finland, MD  diclofenac Sodium (VOLTAREN) 1 % GEL Apply 4 g topically 4 (four) times daily. To affected joint. 06/01/21   Myra Rude, MD  diphenhydrAMINE (BENADRYL) 25 MG tablet Take 1 tablet (25 mg total) by mouth every 6 (six) hours as needed for itching (Rash). 09/21/13   Piepenbrink, Victorino Dike, PA-C  predniSONE (DELTASONE) 20 MG tablet Take  2 tablets (40 mg total) by mouth daily. 02/08/21   Little, Ambrose Finland, MD  tamsulosin (FLOMAX) 0.4 MG CAPS capsule Take 0.4 mg by mouth.    [provider]  valACYclovir (VALTREX) 1000 MG tablet Take 1 tablet (1,000 mg total) by mouth 3 (three) times daily. 02/16/21   Melene Plan, DO      Allergies    Patient has no known allergies.    Review of Systems   Review of Systems  Gastrointestinal:  Positive for abdominal pain.  All other systems reviewed and are negative.   Physical Exam Updated Vital Signs BP (!) 169/101 (BP Location: Right Arm)   Pulse 65   Temp 98.1 F (36.7 C) (Oral)   Resp 16   Ht 5\' 10"  (1.778 m)   Wt 78 kg   SpO2 100%   BMI 24.67 kg/m  Physical Exam Vitals and nursing note reviewed.  Constitutional:      General: He is not in acute distress.    Appearance: He is well-developed.  HENT:     Head: Atraumatic.  Eyes:     Conjunctiva/sclera: Conjunctivae normal.  Cardiovascular:     Rate and Rhythm: Normal rate.  Pulmonary:     Effort: Pulmonary effort is normal.     Breath sounds: Normal breath sounds.  Abdominal:     General: Abdomen is flat.     Palpations: Abdomen is soft.     Tenderness: There is no abdominal tenderness.  Genitourinary:  Comments: Chaperone present during exam.  Normal external genitalia, no thrombosed hemorrhoid no anal fissure or perirectal abscess.  No significant discomfort with digital rectal exam, no impacted stool, no stool noted on glove and no gross bleeding or melena noted. Musculoskeletal:     Cervical back: Neck supple.  Skin:    Findings: No rash.  Neurological:     Mental Status: He is alert.     ED Results / Procedures / Treatments   Labs (all labs ordered are listed, but only abnormal results are displayed) Labs Reviewed  COMPREHENSIVE METABOLIC PANEL - Abnormal; Notable for the following components:      Result Value   Glucose, Bld 107 (*)    Creatinine, Ser 1.44 (*)    GFR, Estimated 47 (*)     All other components within normal limits  LIPASE, BLOOD - Abnormal; Notable for the following components:   Lipase 259 (*)    All other components within normal limits  URINALYSIS, ROUTINE W REFLEX MICROSCOPIC - Abnormal; Notable for the following components:   Color, Urine STRAW (*)    All other components within normal limits  CBC WITH DIFFERENTIAL/PLATELET  OCCULT BLOOD X 1 CARD TO LAB, STOOL    EKG None  Radiology CT ABDOMEN PELVIS W CONTRAST  Result Date: 03/11/2023 CLINICAL DATA:  Abdominal pain. EXAM: CT ABDOMEN AND PELVIS WITH CONTRAST TECHNIQUE: Multidetector CT imaging of the abdomen and pelvis was performed using the standard protocol following bolus administration of intravenous contrast. RADIATION DOSE REDUCTION: This exam was performed according to the departmental dose-optimization program which includes automated exposure control, adjustment of the mA and/or kV according to patient size and/or use of iterative reconstruction technique. CONTRAST:  80mL OMNIPAQUE IOHEXOL 300 MG/ML  SOLN COMPARISON:  08/10/2022. FINDINGS: Lower chest: No acute abnormality. Hepatobiliary: Liver normal in size and attenuation. Small cyst, anterior left lobe. Tiny hypodensity near the anterior dome of the left lobe medial segment, also consistent with a cyst, both stable. No other liver lesions. Two small dependent gallstones. Gallbladder otherwise unremarkable. No bile duct dilation. Pancreas: Unremarkable. No pancreatic ductal dilatation or surrounding inflammatory changes. Spleen: Normal in size without focal abnormality. Adrenals/Urinary Tract: No adrenal masses. Bilateral renal cortical thinning with contour lobulation. Small hypoattenuating lesions along the cortex of the kidneys, largest 5 mm, consistent with cysts, stable with no follow-up recommended. No renal stones. No hydronephrosis. Ureters normal in course and in caliber. Bladder is unremarkable. Stomach/Bowel: Small hiatal hernia. Stomach  otherwise unremarkable. Small bowel and colon are normal in caliber. No wall thickening. No inflammation. Normal appendix visualized. Vascular/Lymphatic: Aortic atherosclerosis. No aneurysm. No enlarged lymph nodes. Reproductive: Multiple radiation therapy seeds within the prostate, stable. Other: No ascites. Musculoskeletal: No for acute fracture or acute finding. Old left rib fractures. No bone lesion. Degenerative changes of the lumbar spine and bilateral hip joint arthropathic changes., most severe on the left. IMPRESSION: 1. No acute findings within the abdomen or pelvis. 2. Aortic atherosclerosis.  Small gallstones. Electronically Signed   By: Amie Portland M.D.   On: 03/11/2023 12:04    Procedures Procedures    Medications Ordered in ED Medications  sodium chloride 0.9 % bolus 1,000 mL (1,000 mLs Intravenous New Bag/Given 03/11/23 1102)  iohexol (OMNIPAQUE) 300 MG/ML solution 60 mL (80 mLs Intravenous Contrast Given 03/11/23 1131)    ED Course/ Medical Decision Making/ A&P  Medical Decision Making Amount and/or Complexity of Data Reviewed Labs: ordered. Radiology: ordered.  Risk Prescription drug management.   BP (!) 169/101 (BP Location: Right Arm)   Pulse 65   Temp 98.1 F (36.7 C) (Oral)   Resp 16   Ht 5\' 10"  (1.778 m)   Wt 78 kg   SpO2 100%   BMI 24.67 kg/m   41:86 AM  89 year old male with a history of prostate cancer, hypertension, Mnire's disease, has pacemaker implant, who presents with complaints of abdominal pain.  Patient report a week and a half ago he was at [employer].  States that he was eating catfish and may have swallowed a small fishbone.  Denies any significant discomfort from that incident.  However several days later he endorsed feeling a bit constipated.  States he just has not had a good bowel movement.  In the past several days he has tried different types of laxative including Pepto-Bismol and enema and this  morning when he had a bowel movement he noticed that his stools black which concerns him.  He endorsed minimal abdominal discomfort and denies any lightheadedness or dizziness no fever or chills no chest pain or shortness of breath.  He is not on any blood thinner medication.  He does not take iron supplementation.  His last colonoscopy was more than 10 years ago.  On exam this is a well-appearing elderly male resting comfortably in bed appears to be in no acute discomfort.  Heart and lung sounds normal abdomen is soft nontender, rectal exam unremarkable and no obvious melena or gross blood noted on exam no impacted stool.  Vital signs notable for elevated blood pressure of 169/101.  Patient is afebrile no hypoxia.  Dark-colored stool may be due to Pepto-Bismol versus upper GI bleed.  Workup initiated.  -Labs ordered, independently viewed and interpreted by me.  Labs remarkable for lipase of 259, will obtain abd/pelvis CT.  Hemoccult negative.   -The patient was maintained on a cardiac monitor.  I personally viewed and interpreted the cardiac monitored which showed an underlying rhythm of: NSR -Imaging independently viewed and interpreted by me and I agree with radiologist's interpretation.  Result remarkable for abd/pelvis CT without acute finding -This patient presents to the ED for concern of black stool, this involves an extensive number of treatment options, and is a complaint that carries with it a high risk of complications and morbidity.  The differential diagnosis includes Peptol Bismo causing dark stool, UGIB, PUD, Gastritis -Co morbidities that complicate the patient evaluation includes prostate CA, hx of ulcer -Treatment includes IVF -Reevaluation of the patient after these medicines showed that the patient improved -PCP office notes or outside notes reviewed -Discussion with specialist attending Dr. Nira Conn -Escalation to admission/observation considered: patients feels much better, is  comfortable with discharge, and will follow up with PCP -Prescription medication considered, patient comfortable with OTC -Social Determinant of Health considered  12:10 PM Due to elevated light pace, and abdominal pelvis CT scan was performed independently viewed interpreted by me which show no acute finding.  I agree with radiology interpretation.  Patient reports that he had a bowel movement in the ED and noticed that his stool is normal and, at this time.  I suspect darker stools likely a result of recent Pepto-Bismol use and less likely to be upper GI bleed.  Encourage patient to follow-up outpatient as needed, return precaution discussed.         Final Clinical Impression(s) / ED  Diagnoses Final diagnoses:  Dark stools    Rx / DC Orders ED Discharge Orders     None         Fayrene Helper, PA-C 03/11/23 1214    Linwood Dibbles, MD 03/11/23 1359

## 2023-03-11 NOTE — Discharge Instructions (Signed)
Your dark-colored stool may be a result of recent use of Pepto-Bismol which sometimes can cause stool discoloration.  CT scan of the abdomen pelvis today did not show any concerning finding.  Please follow-up closely with your doctor, return if any concern.

## 2023-03-11 NOTE — ED Notes (Signed)
Pt transported to CT ?

## 2023-03-11 NOTE — ED Triage Notes (Addendum)
Pt states has hx of ulcer and he  was constipated , he took an enema and his poop was black he states , also states may have swallowed a fishbone about a week and half ago pt also states he has GB issues

## 2023-09-20 ENCOUNTER — Emergency Department (HOSPITAL_BASED_OUTPATIENT_CLINIC_OR_DEPARTMENT_OTHER)
Admission: EM | Admit: 2023-09-20 | Discharge: 2023-09-20 | Disposition: A | Discharge status: Home / Self Care | Payer: Medicare Other | Attending: Emergency Medicine | Admitting: Emergency Medicine

## 2023-09-20 ENCOUNTER — Encounter (HOSPITAL_BASED_OUTPATIENT_CLINIC_OR_DEPARTMENT_OTHER): Payer: Self-pay | Admitting: Emergency Medicine

## 2023-09-20 ENCOUNTER — Emergency Department (HOSPITAL_BASED_OUTPATIENT_CLINIC_OR_DEPARTMENT_OTHER): Payer: Medicare Other

## 2023-09-20 DIAGNOSIS — N179 Acute kidney failure, unspecified: Secondary | ICD-10-CM | POA: Insufficient documentation

## 2023-09-20 DIAGNOSIS — K5900 Constipation, unspecified: Secondary | ICD-10-CM | POA: Diagnosis present

## 2023-09-20 DIAGNOSIS — E860 Dehydration: Secondary | ICD-10-CM | POA: Insufficient documentation

## 2023-09-20 LAB — COMPREHENSIVE METABOLIC PANEL
ALT: 19 U/L (ref 0–44)
AST: 25 U/L (ref 15–41)
Albumin: 4 g/dL (ref 3.5–5.0)
Alkaline Phosphatase: 54 U/L (ref 38–126)
Anion gap: 7 (ref 5–15)
BUN: 37 mg/dL — ABNORMAL HIGH (ref 8–23)
CO2: 26 mmol/L (ref 22–32)
Calcium: 8.9 mg/dL (ref 8.9–10.3)
Chloride: 103 mmol/L (ref 98–111)
Creatinine, Ser: 1.77 mg/dL — ABNORMAL HIGH (ref 0.61–1.24)
GFR, Estimated: 37 mL/min — ABNORMAL LOW (ref >60)
Glucose, Bld: 110 mg/dL — ABNORMAL HIGH (ref 70–99)
Potassium: 4.4 mmol/L (ref 3.5–5.1)
Sodium: 136 mmol/L (ref 135–145)
Total Bilirubin: 0.5 mg/dL (ref <1.2)
Total Protein: 7 g/dL (ref 6.5–8.1)

## 2023-09-20 LAB — CBC WITH DIFFERENTIAL/PLATELET
Abs Immature Granulocytes: 0.02 10*3/uL (ref 0.00–0.07)
Basophils Absolute: 0 10*3/uL (ref 0.0–0.1)
Basophils Relative: 0 %
Eosinophils Absolute: 0 10*3/uL (ref 0.0–0.5)
Eosinophils Relative: 0 %
HCT: 37.8 % — ABNORMAL LOW (ref 39.0–52.0)
Hemoglobin: 12.4 g/dL — ABNORMAL LOW (ref 13.0–17.0)
Immature Granulocytes: 0 %
Lymphocytes Relative: 22 %
Lymphs Abs: 1.5 10*3/uL (ref 0.7–4.0)
MCH: 30.2 pg (ref 26.0–34.0)
MCHC: 32.8 g/dL (ref 30.0–36.0)
MCV: 92 fL (ref 80.0–100.0)
Monocytes Absolute: 1 10*3/uL (ref 0.1–1.0)
Monocytes Relative: 15 %
Neutro Abs: 4.3 10*3/uL (ref 1.7–7.7)
Neutrophils Relative %: 63 %
Platelets: 190 10*3/uL (ref 150–400)
RBC: 4.11 MIL/uL — ABNORMAL LOW (ref 4.22–5.81)
RDW: 12.6 % (ref 11.5–15.5)
WBC: 6.8 10*3/uL (ref 4.0–10.5)
nRBC: 0 % (ref 0.0–0.2)

## 2023-09-20 LAB — LIPASE, BLOOD: Lipase: 52 U/L — ABNORMAL HIGH (ref 11–51)

## 2023-09-20 MED ORDER — IOHEXOL 300 MG/ML  SOLN
80.0000 mL | Freq: Once | INTRAMUSCULAR | Status: AC | PRN
  Administered 2023-09-20: 80 mL via INTRAVENOUS

## 2023-09-20 MED ORDER — MAGNESIUM CITRATE PO SOLN: 1.0000 | Freq: Once | ORAL | 2 refills | Status: AC

## 2023-09-20 MED ORDER — DOCUSATE SODIUM 100 MG PO CAPS: 100.0000 mg | ORAL_CAPSULE | Freq: Two times a day (BID) | ORAL | 0 refills | Status: AC

## 2023-09-20 MED ORDER — POLYETHYLENE GLYCOL 3350 17 G PO PACK: 17.0000 g | PACK | Freq: Every day | ORAL | 0 refills | Status: DC

## 2023-09-20 MED ORDER — LACTATED RINGERS IV BOLUS
1000.0000 mL | Freq: Once | INTRAVENOUS | Status: AC
  Administered 2023-09-20: 1000 mL via INTRAVENOUS

## 2023-09-20 NOTE — ED Notes (Signed)
Patient presents he fell last week causing his "hernia in my stomach to move and I can't poop". Patient reports that he has an appointment to see a doctor next week for this issue but  he cannot wait that long. Patient denies pain and N/V.

## 2023-09-20 NOTE — ED Triage Notes (Signed)
Pt states has a hernia, was raking leaves last week and fell, thinks his hernia moved. Seen by PCP and got a referral to gastro next week. Feel like not moving right. Not having good BM's.

## 2023-09-21 NOTE — ED Provider Notes (Signed)
[place] [facility] AT [facility] Provider Note   CSN: [id number] Arrival date & time: 09/20/23  0315     History  Chief Complaint  Patient presents with   Constipation    Richard Osborne is a 89 y.o. male.  A 67 male with a history of hernias that presents ER today secondary to abdominal pain.  Patient states he felt a few days back and feels like his hernia shifted.  He cannot feel right side anymore.  States he has a history of chronic constipation but usually having bowel movements once a day or maybe every other day however he has not had a bowel movement in the last 3 days.  He has tried some laxatives at home which only cause diarrhea or nothing.  States that he had a bowel movement prior to coming in but it was just small pieces and not a full movement.  No fever, nausea, vomiting or abdominal distention.   Constipation      Home Medications Prior to Admission medications   Medication Sig Start Date End Date Taking? Authorizing Provider  docusate sodium (COLACE) 100 MG capsule Take 1 capsule (100 mg total) by mouth every 12 (twelve) hours. 09/20/23  Yes Salvatore Poe, Barbara Cower, MD  polyethylene glycol (MIRALAX / GLYCOLAX) 17 g packet Take 17 g by mouth daily. 09/20/23  Yes Devon Kingdon, Barbara Cower, MD  acetaminophen (TYLENOL) 500 MG tablet Take 2 tablets (1,000 mg total) by mouth every 6 (six) hours as needed for moderate pain. 02/08/21   Little, Ambrose Finland, MD  diclofenac Sodium (VOLTAREN) 1 % GEL Apply 4 g topically 4 (four) times daily. To affected joint. 06/01/21   Myra Rude, MD  diphenhydrAMINE (BENADRYL) 25 MG tablet Take 1 tablet (25 mg total) by mouth every 6 (six) hours as needed for itching (Rash). 09/21/13   Piepenbrink, Victorino Dike, PA-C  predniSONE (DELTASONE) 20 MG tablet Take 2 tablets (40 mg total) by mouth daily. 02/08/21   Little, Ambrose Finland, MD  tamsulosin (FLOMAX) 0.4 MG CAPS capsule Take 0.4 mg by mouth.    [provider]   valACYclovir (VALTREX) 1000 MG tablet Take 1 tablet (1,000 mg total) by mouth 3 (three) times daily. 02/16/21   Melene Plan, DO      Allergies    Patient has no known allergies.    Review of Systems   Review of Systems  Gastrointestinal:  Positive for constipation.    Physical Exam Updated Vital Signs BP (!) 152/89   Pulse 80   Temp 98 F (36.7 C)   Resp 17   Ht 5\' 10"  (1.778 m)   Wt 72.6 kg   SpO2 100%   BMI 22.96 kg/m  Physical Exam Vitals and nursing note reviewed.  Constitutional:      Appearance: He is well-developed.  HENT:     Head: Normocephalic and atraumatic.  Eyes:     Pupils: Pupils are equal, round, and reactive to light.  Cardiovascular:     Rate and Rhythm: Normal rate.  Pulmonary:     Effort: Pulmonary effort is normal. No respiratory distress.  Abdominal:     General: There is no distension.     Comments: No palpable hernias.  Patient is points out to where he said it used to be however I cannot identify hernia in that area.  Abdomen is otherwise benign, no distention.  Musculoskeletal:        General: Normal range of motion.     Cervical back:  Normal range of motion.  Neurological:     Mental Status: He is alert.     ED Results / Procedures / Treatments   Labs (all labs ordered are listed, but only abnormal results are displayed) Labs Reviewed  CBC WITH DIFFERENTIAL/PLATELET - Abnormal; Notable for the following components:      Result Value   RBC 4.11 (*)    Hemoglobin 12.4 (*)    HCT 37.8 (*)    All other components within normal limits  COMPREHENSIVE METABOLIC PANEL - Abnormal; Notable for the following components:   Glucose, Bld 110 (*)    BUN 37 (*)    Creatinine, Ser 1.77 (*)    GFR, Estimated 37 (*)    All other components within normal limits  LIPASE, BLOOD - Abnormal; Notable for the following components:   Lipase 52 (*)    All other components within normal limits    EKG None  Radiology CT ABDOMEN PELVIS W  CONTRAST Result Date: 09/20/2023 CLINICAL DATA:  Acute, nonlocalized abdominal pain EXAM: CT ABDOMEN AND PELVIS WITH CONTRAST TECHNIQUE: Multidetector CT imaging of the abdomen and pelvis was performed using the standard protocol following bolus administration of intravenous contrast. RADIATION DOSE REDUCTION: This exam was performed according to the departmental dose-optimization program which includes automated exposure control, adjustment of the mA and/or kV according to patient size and/or use of iterative reconstruction technique. CONTRAST:  80mL OMNIPAQUE IOHEXOL 300 MG/ML  SOLN COMPARISON:  03/11/2023 FINDINGS: Lower chest: Dual-chamber pacer on the right. Reticulation in the lower lungs which is stable and attributed to chronic lung disease/scarring. Hepatobiliary: No significant liver abnormality.Small left lateral hepatic cyst. Calcified gallstone. No evidence of cholecystitis. No biliary dilatation. Pancreas: Unremarkable. Spleen: Unremarkable. Adrenals/Urinary Tract: Negative adrenals. No hydronephrosis or stone. Patchy renal cortical scarring on the left more than right. Low densities in the renal cortex which are too small for accurate densitometry. Unremarkable bladder. Stomach/Bowel:  No obstruction. No visible bowel inflammation. Vascular/Lymphatic: Diffuse atheromatous calcification of the aorta and iliacs. No mass or adenopathy. Reproductive:Prostate brachytherapy seeds. Partially covered and possibly encysted hydrocele on the left, moderate. Other: No ascites or pneumoperitoneum. Musculoskeletal: No acute abnormalities. Generalized lumbar spine degeneration. Hip osteoarthritis which is advanced on the left with bone-on-bone contact and subchondral cysts. IMPRESSION: 1. No acute finding or specific cause for symptoms. 2. Atherosclerosis and cholelithiasis. Electronically Signed   By: Tiburcio Pea M.D.   On: 09/20/2023 06:17    Procedures Procedures    Medications Ordered in  ED Medications  iohexol (OMNIPAQUE) 300 MG/ML solution 80 mL (80 mLs Intravenous Contrast Given 09/20/23 0554)  lactated ringers bolus 1,000 mL (0 mLs Intravenous Stopped 09/20/23 0746)    ED Course/ Medical Decision Making/ A&P                                 Medical Decision Making Amount and/or Complexity of Data Reviewed Labs: ordered. Radiology: ordered.  Risk OTC drugs. Prescription drug management.   89 yo M w/ h/o hernias and now with decreased BM's.  CT scan to have a rule out obstruction, incarceration or internal hernia however this was negative on my interpretation, radiology read reviewed as well.  He does have a mild AKI dehydration is 1 leading causes of constipation so encouraged increasing his fluids.  He given some IV fluids prior to leaving here.  Also gave him some suggestions on MiraLAX, magnesium citrate and Colace use  to try to get his bowels moving and he maintain good bowel movements.  Will follow-up with his outpatient physicians if not improved in 2 to 3 days.   Final Clinical Impression(s) / ED Diagnoses Final diagnoses:  Constipation, unspecified constipation type  Dehydration  AKI (acute kidney injury) (HCC)    Rx / DC Orders ED Discharge Orders          Ordered    magnesium citrate SOLN   Once        09/20/23 0657    docusate sodium (COLACE) 100 MG capsule  Every 12 hours        09/20/23 0657    polyethylene glycol (MIRALAX / GLYCOLAX) 17 g packet  Daily        09/20/23 0657              Eastyn Skalla, Barbara Cower, MD 09/21/23 [phone]

## 2023-09-26 DIAGNOSIS — R413 Other amnesia: Secondary | ICD-10-CM | POA: Diagnosis present

## 2023-10-28 ENCOUNTER — Other Ambulatory Visit: Payer: Self-pay

## 2023-10-28 ENCOUNTER — Encounter (HOSPITAL_BASED_OUTPATIENT_CLINIC_OR_DEPARTMENT_OTHER): Payer: Self-pay

## 2023-10-28 ENCOUNTER — Emergency Department (HOSPITAL_BASED_OUTPATIENT_CLINIC_OR_DEPARTMENT_OTHER): Payer: Medicare Other

## 2023-10-28 ENCOUNTER — Emergency Department (HOSPITAL_BASED_OUTPATIENT_CLINIC_OR_DEPARTMENT_OTHER)
Admission: EM | Admit: 2023-10-28 | Discharge: 2023-10-28 | Disposition: A | Discharge status: Home / Self Care | Payer: Medicare Other | Attending: Emergency Medicine | Admitting: Emergency Medicine

## 2023-10-28 DIAGNOSIS — R1032 Left lower quadrant pain: Secondary | ICD-10-CM | POA: Insufficient documentation

## 2023-10-28 DIAGNOSIS — M549 Dorsalgia, unspecified: Secondary | ICD-10-CM | POA: Insufficient documentation

## 2023-10-28 DIAGNOSIS — R109 Unspecified abdominal pain: Secondary | ICD-10-CM

## 2023-10-28 DIAGNOSIS — R1012 Left upper quadrant pain: Secondary | ICD-10-CM | POA: Insufficient documentation

## 2023-10-28 LAB — COMPREHENSIVE METABOLIC PANEL
ALT: 17 U/L (ref 0–44)
AST: 24 U/L (ref 15–41)
Albumin: 3.8 g/dL (ref 3.5–5.0)
Alkaline Phosphatase: 55 U/L (ref 38–126)
Anion gap: 10 (ref 5–15)
BUN: 19 mg/dL (ref 8–23)
CO2: 25 mmol/L (ref 22–32)
Calcium: 9 mg/dL (ref 8.9–10.3)
Chloride: 105 mmol/L (ref 98–111)
Creatinine, Ser: 1.34 mg/dL — ABNORMAL HIGH (ref 0.61–1.24)
GFR, Estimated: 51 mL/min — ABNORMAL LOW (ref >60)
Glucose, Bld: 88 mg/dL (ref 70–99)
Potassium: 3.8 mmol/L (ref 3.5–5.1)
Sodium: 140 mmol/L (ref 135–145)
Total Bilirubin: 0.8 mg/dL (ref 0.0–1.2)
Total Protein: 6.8 g/dL (ref 6.5–8.1)

## 2023-10-28 LAB — CBC WITH DIFFERENTIAL/PLATELET
Abs Immature Granulocytes: 0.02 10*3/uL (ref 0.00–0.07)
Basophils Absolute: 0 10*3/uL (ref 0.0–0.1)
Basophils Relative: 0 %
Eosinophils Absolute: 0 10*3/uL (ref 0.0–0.5)
Eosinophils Relative: 0 %
HCT: 40.7 % (ref 39.0–52.0)
Hemoglobin: 13.2 g/dL (ref 13.0–17.0)
Immature Granulocytes: 0 %
Lymphocytes Relative: 23 %
Lymphs Abs: 1.5 10*3/uL (ref 0.7–4.0)
MCH: 29.9 pg (ref 26.0–34.0)
MCHC: 32.4 g/dL (ref 30.0–36.0)
MCV: 92.3 fL (ref 80.0–100.0)
Monocytes Absolute: 0.9 10*3/uL (ref 0.1–1.0)
Monocytes Relative: 13 %
Neutro Abs: 4.2 10*3/uL (ref 1.7–7.7)
Neutrophils Relative %: 64 %
Platelets: 170 10*3/uL (ref 150–400)
RBC: 4.41 MIL/uL (ref 4.22–5.81)
RDW: 12.2 % (ref 11.5–15.5)
WBC: 6.6 10*3/uL (ref 4.0–10.5)
nRBC: 0 % (ref 0.0–0.2)

## 2023-10-28 LAB — URINALYSIS, ROUTINE W REFLEX MICROSCOPIC
Bilirubin Urine: NEGATIVE
Glucose, UA: NEGATIVE mg/dL
Hgb urine dipstick: NEGATIVE
Ketones, ur: NEGATIVE mg/dL
Leukocytes,Ua: NEGATIVE
Nitrite: NEGATIVE
Protein, ur: NEGATIVE mg/dL
Specific Gravity, Urine: 1.015 (ref 1.005–1.030)
pH: 7.5 (ref 5.0–8.0)

## 2023-10-28 LAB — LIPASE, BLOOD: Lipase: 47 U/L (ref 11–51)

## 2023-10-28 NOTE — ED Triage Notes (Signed)
Pt reports he can no longer feel his right inguinal pain for 2 days. No pain ,but is concerned because he feels non tender lump left mid back and concerned hernia has moved to back

## 2023-10-28 NOTE — Discharge Instructions (Signed)
 If you develop worsening, continued, or recurrent abdominal pain, uncontrolled vomiting, fever, chest or back pain, or any other new/concerning symptoms then return to the ER for evaluation.

## 2023-10-28 NOTE — ED Provider Notes (Signed)
[place] [facility] Provider Note   CSN: [id number] Arrival date & time: 10/28/23  8315     History  Chief Complaint  Patient presents with   Hernia   Mass    Richard Osborne is a 89 y.o. male.  HPI 89 year old male presents with abdominal pain and back pain.  He states that over the last 3-4 days his abdomen has been "tender" but not hurting, primarily in his left upper quadrant and left flank.  No urinary symptoms, vomiting, diarrhea, constipation.  He states he normally can feel his right inguinal hernia on exam but he has not been able to feel it for the last couple days.  He also is having some left greater than right back pain.  No chest pain or shortness of breath.  Home Medications Prior to Admission medications   Medication Sig Start Date End Date Taking? Authorizing Provider  acetaminophen (TYLENOL) 500 MG tablet Take 2 tablets (1,000 mg total) by mouth every 6 (six) hours as needed for moderate pain. 02/08/21   Little, Ambrose Finland, MD  diclofenac Sodium (VOLTAREN) 1 % GEL Apply 4 g topically 4 (four) times daily. To affected joint. 06/01/21   Myra Rude, MD  diphenhydrAMINE (BENADRYL) 25 MG tablet Take 1 tablet (25 mg total) by mouth every 6 (six) hours as needed for itching (Rash). 09/21/13   Piepenbrink, Victorino Dike, PA-C  docusate sodium (COLACE) 100 MG capsule Take 1 capsule (100 mg total) by mouth every 12 (twelve) hours. 09/20/23   Mesner, Barbara Cower, MD  polyethylene glycol (MIRALAX / GLYCOLAX) 17 g packet Take 17 g by mouth daily. 09/20/23   Mesner, Barbara Cower, MD  predniSONE (DELTASONE) 20 MG tablet Take 2 tablets (40 mg total) by mouth daily. 02/08/21   Little, Ambrose Finland, MD  tamsulosin (FLOMAX) 0.4 MG CAPS capsule Take 0.4 mg by mouth.    [provider]  valACYclovir (VALTREX) 1000 MG tablet Take 1 tablet (1,000 mg total) by mouth 3 (three) times daily. 02/16/21   Melene Plan, DO      Allergies    Patient has no known  allergies.    Review of Systems   Review of Systems  Gastrointestinal:  Positive for abdominal pain. Negative for constipation, diarrhea and vomiting.  Genitourinary:  Negative for dysuria.  Musculoskeletal:  Positive for back pain.    Physical Exam Updated Vital Signs BP (!) 148/79   Pulse 61   Temp (!) 97.5 F (36.4 C)   Resp 18   Wt 73.5 kg   SpO2 100%   BMI 23.24 kg/m  Physical Exam Vitals and nursing note reviewed.  Constitutional:      Appearance: He is well-developed.  HENT:     Head: Normocephalic and atraumatic.  Cardiovascular:     Rate and Rhythm: Normal rate and regular rhythm.     Heart sounds: Normal heart sounds.  Pulmonary:     Effort: Pulmonary effort is normal.     Breath sounds: Normal breath sounds.  Abdominal:     Palpations: Abdomen is soft.     Tenderness: There is abdominal tenderness in the left upper quadrant and left lower quadrant. There is no right CVA tenderness or left CVA tenderness.     Hernia: There is no hernia in the left inguinal area or right inguinal area.     Comments: No rash to abdomen or back/flank.  Genitourinary:    Testes:        Right: Swelling  not present.        Left: Swelling not present.  Musculoskeletal:     Thoracic back: No tenderness.     Lumbar back: No tenderness.  Skin:    General: Skin is warm and dry.  Neurological:     Mental Status: He is alert.     ED Results / Procedures / Treatments   Labs (all labs ordered are listed, but only abnormal results are displayed) Labs Reviewed  COMPREHENSIVE METABOLIC PANEL - Abnormal; Notable for the following components:      Result Value   Creatinine, Ser 1.34 (*)    GFR, Estimated 51 (*)    All other components within normal limits  CBC WITH DIFFERENTIAL/PLATELET  URINALYSIS, ROUTINE W REFLEX MICROSCOPIC  LIPASE, BLOOD    EKG EKG Interpretation Date/Time:  Saturday October 28 2023 10:46:25 EST Ventricular Rate:  63 PR Interval:  132 QRS  Duration:  155 QT Interval:  439 QTC Calculation: 450 R Axis:   -68  Text Interpretation: Sinus or ectopic atrial rhythm RBBB and LAFB Left ventricular hypertrophy overall similar to 2012 Confirmed by Pricilla Loveless [phone]) on 10/28/2023 10:48:03 AM  Radiology CT ABDOMEN PELVIS WO CONTRAST Result Date: 10/28/2023 CLINICAL DATA:  Left upper quadrant abdominal pain tender lump of the left back. EXAM: CT ABDOMEN AND PELVIS WITHOUT CONTRAST TECHNIQUE: Multidetector CT imaging of the abdomen and pelvis was performed following the standard protocol without IV contrast. RADIATION DOSE REDUCTION: This exam was performed according to the departmental dose-optimization program which includes automated exposure control, adjustment of the mA and/or kV according to patient size and/or use of iterative reconstruction technique. COMPARISON:  09/20/2023 FINDINGS: Lower chest: Chronic scarring at the lung bases as seen previously. Small hiatal hernia. Hepatobiliary: Few small simple cysts of the liver as seen previously. Small stones dependent in the gallbladder. No sign of gallbladder inflammation or biliary obstruction. Pancreas: Normal Spleen: Normal Adrenals/Urinary Tract: No significant adrenal finding. No evidence of renal obstruction, mass or stone. Bladder is normal. Stomach/Bowel: Stomach shows small hiatal hernia as noted above. Small bowel is normal. Normal appendix. Normal colon. Vascular/Lymphatic: Aortic atherosclerosis. No aneurysm. IVC is normal. No adenopathy. Reproductive: Multiple seed implants throughout the prostate. Otherwise negative. Other: Right inguinal hernia containing fat. Small-bowel intrudes towards the proximal hernia but there is no incarceration or strangulation. The patient had a concern about a developing abnormality of the left mid back. No abnormality is seen by CT. No posterior hernia. No evidence of soft tissue mass or lesion. Musculoskeletal: Chronic lower lumbar degenerative  changes. Chronic osteoarthritis of the hips, worse on the left than the right. IMPRESSION: 1. No acute finding by CT. No abnormality seen in the left mid back. 2. Small hiatal hernia. 3. Cholelithiasis without evidence of cholecystitis or biliary obstruction. 4. Right inguinal hernia containing fat. Small-bowel intrudes towards the proximal hernia but there is no incarceration or strangulation. 5. Aortic atherosclerosis. 6. Chronic lower lumbar degenerative changes. Chronic osteoarthritis of the hips, worse on the left than the right. Aortic Atherosclerosis (ICD10-I70.0). Electronically Signed   By: Paulina Fusi M.D.   On: 10/28/2023 11:37    Procedures Procedures    Medications Ordered in ED Medications - No data to display  ED Course/ Medical Decision Making/ A&P                                 Medical Decision Making Amount and/or Complexity of Data  Reviewed Labs: ordered.    Details: CKD but improving compared to prior.  Normal WBC Radiology: ordered and independent interpretation performed.    Details: No bowel obstruction or ureteral stone ECG/medicine tests: ordered and independent interpretation performed.    Details: No sniffing change from baseline   Patient reports that he has no actual pain, just tenderness along his abdomen.  I do not see any obvious abnormalities on his back on exam or on CT.  Vitals are reassuring besides some mild hypertension.  EKG is unchanged from baseline and there is no chest pain or shortness of breath and he has reproducible symptoms so I think ACS is on likely.  Urine is unremarkable.  He does continue to have a right inguinal hernia though certainly no signs of incarceration on exam or CT.  Will discharge home with return precautions, follow-up with PCP.        Final Clinical Impression(s) / ED Diagnoses Final diagnoses:  Left sided abdominal pain    Rx / DC Orders ED Discharge Orders     None         Pricilla Loveless,  MD 10/28/23 1258

## 2024-02-03 ENCOUNTER — Emergency Department (HOSPITAL_BASED_OUTPATIENT_CLINIC_OR_DEPARTMENT_OTHER)

## 2024-02-03 ENCOUNTER — Other Ambulatory Visit: Payer: Self-pay

## 2024-02-03 ENCOUNTER — Encounter (HOSPITAL_BASED_OUTPATIENT_CLINIC_OR_DEPARTMENT_OTHER): Payer: Self-pay

## 2024-02-03 ENCOUNTER — Emergency Department (HOSPITAL_BASED_OUTPATIENT_CLINIC_OR_DEPARTMENT_OTHER)
Admission: EM | Admit: 2024-02-03 | Discharge: 2024-02-03 | Disposition: A | Discharge status: Home / Self Care | Attending: Emergency Medicine | Admitting: Emergency Medicine

## 2024-02-03 DIAGNOSIS — I480 Paroxysmal atrial fibrillation: Secondary | ICD-10-CM | POA: Diagnosis not present

## 2024-02-03 DIAGNOSIS — R059 Cough, unspecified: Secondary | ICD-10-CM | POA: Diagnosis not present

## 2024-02-03 DIAGNOSIS — Z8546 Personal history of malignant neoplasm of prostate: Secondary | ICD-10-CM | POA: Insufficient documentation

## 2024-02-03 DIAGNOSIS — I10 Essential (primary) hypertension: Secondary | ICD-10-CM | POA: Diagnosis not present

## 2024-02-03 DIAGNOSIS — R509 Fever, unspecified: Secondary | ICD-10-CM | POA: Insufficient documentation

## 2024-02-03 LAB — RESP PANEL BY RT-PCR (RSV, FLU A&B, COVID)  RVPGX2
Influenza A by PCR: NEGATIVE
Influenza B by PCR: NEGATIVE
Resp Syncytial Virus by PCR: NEGATIVE
SARS Coronavirus 2 by RT PCR: NEGATIVE

## 2024-02-03 MED ORDER — CETIRIZINE HCL 5 MG PO TABS: 5.0000 mg | ORAL_TABLET | Freq: Every day | ORAL | 0 refills | Status: DC

## 2024-02-03 NOTE — ED Triage Notes (Signed)
 Pt states "pollen started this" had a fever of 99.7 and was coughing up some yellow mucous. Pt also had a light headache yesterday. Pt states that the coughing may be going away. Pt denies pain and sob.   Anastacio Balm, RN

## 2024-02-03 NOTE — ED Provider Notes (Signed)
 [place] [facility]MENT AT [facility] Provider Note  CSN: [id number] Arrival date & time: 02/03/24 6606  Chief Complaint(s) Fever  HPI Richard Osborne is a 89 y.o. male with past medical history as below, significant for hld, htn, pAF, prostate/bladder ca who presents to the ED with complaint of fever  Patient had a fever last week, low-grade fever 99.7.  He had some postnasal drip, occasional yellow.  Sinus pressure and congestion.  Occasionally coughing up sputum.  Certain that he had a "pollen attack."  The symptoms have all resolved.  No symptoms in the last 2 or 3 days.  He has no chest pain or dyspnea.  No fevers, no nausea or vomiting.  Past Medical History Past Medical History:  Diagnosis Date   High cholesterol    Hypertension    Meniere's disease    Prostate cancer [facility])    Patient Active Problem List   Diagnosis Date Noted   Pseudogout of left knee 06/01/2021   Home Medication(s) Prior to Admission medications   Medication Sig Start Date End Date Taking? Authorizing Provider  acetaminophen  (TYLENOL ) 500 MG tablet Take 2 tablets (1,000 mg total) by mouth every 6 (six) hours as needed for moderate pain. 02/08/21   Little, Hebert Littler, MD  diclofenac  Sodium (VOLTAREN ) 1 % GEL Apply 4 g topically 4 (four) times daily. To affected joint. 06/01/21   Margaree Shark, MD  diphenhydrAMINE  (BENADRYL ) 25 MG tablet Take 1 tablet (25 mg total) by mouth every 6 (six) hours as needed for itching (Rash). 09/21/13   Piepenbrink, Bridgette Campus, PA-C  docusate sodium  (COLACE) 100 MG capsule Take 1 capsule (100 mg total) by mouth every 12 (twelve) hours. 09/20/23   Mesner, Jason, MD  polyethylene glycol (MIRALAX  / GLYCOLAX ) 17 g packet Take 17 g by mouth daily. 09/20/23   Mesner, Reymundo Caulk, MD  predniSONE  (DELTASONE ) 20 MG tablet Take 2 tablets (40 mg total) by mouth daily. 02/08/21   Little, Hebert Littler, MD  tamsulosin (FLOMAX) 0.4 MG CAPS capsule Take 0.4 mg by mouth.     [provider]  valACYclovir  (VALTREX ) 1000 MG tablet Take 1 tablet (1,000 mg total) by mouth 3 (three) times daily. 02/16/21   Albertus Hughs, DO                                                                                                                                    Past Surgical History Past Surgical History:  Procedure Laterality Date   otic nerve surgery     PACEMAKER IMPLANT     Family History History reviewed. No pertinent family history.  Social History Social History   Tobacco Use   Smoking status: Never   Smokeless tobacco: Never  Substance Use Topics   Alcohol use: No   Drug use: No   Allergies Patient has no known allergies.  Review of Systems A thorough review of systems was obtained  and all systems are negative except as noted in the HPI and PMH.   Physical Exam Vital Signs  I have reviewed the triage vital signs BP (!) 140/77   Pulse 85   Temp 98 F (36.7 C) (Oral)   Ht 5\' 10"  (1.778 m)   Wt 73.9 kg   SpO2 98%   BMI 23.39 kg/m  Physical Exam Vitals and nursing note reviewed.  Constitutional:      General: He is not in acute distress.    Appearance: He is well-developed.  HENT:     Head: Normocephalic and atraumatic.     Right Ear: External ear normal.     Left Ear: External ear normal.     Mouth/Throat:     Mouth: Mucous membranes are moist.  Eyes:     General: No scleral icterus. Cardiovascular:     Rate and Rhythm: Normal rate and regular rhythm.     Pulses: Normal pulses.     Heart sounds: Normal heart sounds.  Pulmonary:     Effort: Pulmonary effort is normal. No respiratory distress.     Breath sounds: Normal breath sounds.  Abdominal:     General: Abdomen is flat.     Palpations: Abdomen is soft.     Tenderness: There is no abdominal tenderness.  Musculoskeletal:     Cervical back: No rigidity.     Right lower leg: No edema.     Left lower leg: No edema.  Skin:    General: Skin is warm and dry.     Capillary  Refill: Capillary refill takes less than 2 seconds.  Neurological:     Mental Status: He is alert.  Psychiatric:        Mood and Affect: Mood normal.        Behavior: Behavior normal.     ED Results and Treatments Labs (all labs ordered are listed, but only abnormal results are displayed) Labs Reviewed  RESP PANEL BY RT-PCR (RSV, FLU A&B, COVID)  RVPGX2                                                                                                                          Radiology DG Chest 2 View Result Date: 02/03/2024 CLINICAL DATA:  Cough. EXAM: CHEST - 2 VIEW COMPARISON:  06/08/15 FINDINGS: There is a left chest wall pacer device with leads in the right atrial appendage and right ventricle. Cardiomediastinal contours appear normal. There is no significant pleural effusion or interstitial edema. Fine reticular and nodular interstitial opacities are identified with a peripheral and basilar predominant, greater in the right lung compared with the left. No superimposed interstitial edema or consolidative change. No signs of pneumothorax. IMPRESSION: 1. No acute cardiopulmonary abnormalities. 2. Fine reticular and nodular interstitial opacities with a peripheral and basilar predominant, greater in the right lung compared with the left. Imaging findings are concerning for underlying chronic interstitial lung disease. Consider more definitive characterization with follow-up nonemergent high-resolution CT of  the chest. Electronically Signed   By: Kimberley Penman M.D.   On: 02/03/2024 09:16    Pertinent labs & imaging results that were available during my care of the patient were reviewed by me and considered in my medical decision making (see MDM for details).  Medications Ordered in ED Medications - No data to display                                                                                                                                   Procedures Procedures  (including critical  care time)  Medical Decision Making / ED Course    Medical Decision Making:    Richard Osborne is a 89 y.o. male with past medical history as below, significant for hld, htn, pAF, prostate/bladder ca who presents to the ED with complaint of fever. The complaint involves an extensive differential diagnosis and also carries with it a high risk of complications and morbidity.  Serious etiology was considered. Ddx includes but is not limited to: Differential diagnosis for adult fever includes but is not exclusive to community-acquired pneumonia, urinary tract infection, acute cholecystitis, viral syndrome, cellulitis, tick bourne disease,  decubitus ulcer, necrotizing fasciitis, meningitis, encephalitis, influenza, etc.   Complete initial physical exam performed, notably the patient was in no distress, no hypoxia, afebrile.    Reviewed and confirmed nursing documentation for past medical history, family history, social history.  Vital signs reviewed.     Brief summary:  89 year old male with history as above here with cough, low-grade fever last week.  Attributed symptoms to increased pollen.  Symptoms have resolved.  He feels back to normal.   Clinical Course as of 02/03/24 1007  Sat Feb 03, 2024  0930 CXR w/o pna, ?ILD [SG]  0957 Swabs negative [SG]    Clinical Course User Index [SG] Teddi Favors, DO     Reviewed x-ray findings with patient, recommend he follow-up with his PCP for for outpatient CT chest. Recommend he take antihistamine daily, avoid excessive pollen exposure. No chest pain, no dyspnea, no fever.  He is HDS.  He feels back to normal, he is eager for discharge.  Patient in no distress and overall condition improved here in the ED. Detailed discussions were had with the patient/guardian regarding current findings, and need for close f/u with PCP or on call doctor. The patient/guardian has been instructed to return immediately if the symptoms worsen in any way for  re-evaluation. Patient/guardian verbalized understanding and is in agreement with current care plan. All questions answered prior to discharge.              Additional history obtained: -Additional history obtained from na -External records from outside source obtained and reviewed including: Chart review including previous notes, labs, imaging, consultation notes including  Home medications, prior ER visits, prior cardiology documentation   Lab Tests: -I ordered, reviewed, and interpreted labs.   The pertinent  results include:   Labs Reviewed  RESP PANEL BY RT-PCR (RSV, FLU A&B, COVID)  RVPGX2    Notable for swabs neg  EKG   EKG Interpretation Date/Time:    Ventricular Rate:    PR Interval:    QRS Duration:    QT Interval:    QTC Calculation:   R Axis:      Text Interpretation:           Imaging Studies ordered: I ordered imaging studies including cxr I independently visualized the following imaging with scope of interpretation limited to determining acute life threatening conditions related to emergency care; findings noted above I agree with the radiologist interpretation If any imaging was obtained with contrast I closely monitored patient for any possible adverse reaction a/w contrast administration in the emergency department   Medicines ordered and prescription drug management: No orders of the defined types were placed in this encounter.   -I have reviewed the patients home medicines and have made adjustments as needed   Consultations Obtained: na   Cardiac Monitoring: Continuous pulse oximetry interpreted by myself, 100% on RA.    Social Determinants of Health:  Diagnosis or treatment significantly limited by social determinants of health: na   Reevaluation: After the interventions noted above, I reevaluated the patient and found that they have resolved  Co morbidities that complicate the patient evaluation  Past Medical History:   Diagnosis Date   High cholesterol    Hypertension    Meniere's disease    Prostate cancer (HCC)       Dispostion: Disposition decision including need for hospitalization was considered, and patient discharged from emergency department.    Final Clinical Impression(s) / ED Diagnoses Final diagnoses:  Cough, unspecified type        Teddi Favors, DO 02/03/24 1007

## 2024-02-03 NOTE — Discharge Instructions (Addendum)
 It was a pleasure caring for you today in the emergency department.  Please follow-up with your PCP for nonemergent CAT scan of your chest  Consider taking daily antihistamine, a prescription for zyrtec was sent to your pharmacy. This is also available over the counter.  Please return to the emergency department for any worsening or worrisome symptoms.

## 2024-02-05 ENCOUNTER — Other Ambulatory Visit: Payer: Self-pay

## 2024-02-05 ENCOUNTER — Encounter (HOSPITAL_BASED_OUTPATIENT_CLINIC_OR_DEPARTMENT_OTHER): Payer: Self-pay

## 2024-02-05 ENCOUNTER — Emergency Department (HOSPITAL_BASED_OUTPATIENT_CLINIC_OR_DEPARTMENT_OTHER)
Admission: EM | Admit: 2024-02-05 | Discharge: 2024-02-05 | Disposition: A | Discharge status: Home / Self Care | Attending: Emergency Medicine | Admitting: Emergency Medicine

## 2024-02-05 DIAGNOSIS — M62838 Other muscle spasm: Secondary | ICD-10-CM | POA: Insufficient documentation

## 2024-02-05 DIAGNOSIS — Z8546 Personal history of malignant neoplasm of prostate: Secondary | ICD-10-CM | POA: Insufficient documentation

## 2024-02-05 DIAGNOSIS — M542 Cervicalgia: Secondary | ICD-10-CM | POA: Diagnosis present

## 2024-02-05 DIAGNOSIS — I10 Essential (primary) hypertension: Secondary | ICD-10-CM | POA: Insufficient documentation

## 2024-02-05 LAB — CBC WITH DIFFERENTIAL/PLATELET
Abs Immature Granulocytes: 0.03 10*3/uL (ref 0.00–0.07)
Basophils Absolute: 0 10*3/uL (ref 0.0–0.1)
Basophils Relative: 0 %
Eosinophils Absolute: 0 10*3/uL (ref 0.0–0.5)
Eosinophils Relative: 0 %
HCT: 40.4 % (ref 39.0–52.0)
Hemoglobin: 13 g/dL (ref 13.0–17.0)
Immature Granulocytes: 0 %
Lymphocytes Relative: 18 %
Lymphs Abs: 1.9 10*3/uL (ref 0.7–4.0)
MCH: 29.9 pg (ref 26.0–34.0)
MCHC: 32.2 g/dL (ref 30.0–36.0)
MCV: 92.9 fL (ref 80.0–100.0)
Monocytes Absolute: 1.6 10*3/uL — ABNORMAL HIGH (ref 0.1–1.0)
Monocytes Relative: 15 %
Neutro Abs: 6.7 10*3/uL (ref 1.7–7.7)
Neutrophils Relative %: 67 %
Platelets: 230 10*3/uL (ref 150–400)
RBC: 4.35 MIL/uL (ref 4.22–5.81)
RDW: 12.4 % (ref 11.5–15.5)
WBC: 10.2 10*3/uL (ref 4.0–10.5)
nRBC: 0 % (ref 0.0–0.2)

## 2024-02-05 LAB — BASIC METABOLIC PANEL WITH GFR
Anion gap: 16 — ABNORMAL HIGH (ref 5–15)
BUN: 18 mg/dL (ref 8–23)
CO2: 21 mmol/L — ABNORMAL LOW (ref 22–32)
Calcium: 10 mg/dL (ref 8.9–10.3)
Chloride: 104 mmol/L (ref 98–111)
Creatinine, Ser: 1.6 mg/dL — ABNORMAL HIGH (ref 0.61–1.24)
GFR, Estimated: 41 mL/min — ABNORMAL LOW (ref >60)
Glucose, Bld: 87 mg/dL (ref 70–99)
Potassium: 4.5 mmol/L (ref 3.5–5.1)
Sodium: 140 mmol/L (ref 135–145)

## 2024-02-05 MED ORDER — METHOCARBAMOL 500 MG PO TABS
500.0000 mg | ORAL_TABLET | Freq: Two times a day (BID) | ORAL | 0 refills | Status: DC | PRN
  Filled 2024-02-05: qty 20, 10d supply, fill #0

## 2024-02-05 MED ORDER — ACETAMINOPHEN 500 MG PO TABS
1000.0000 mg | ORAL_TABLET | Freq: Once | ORAL | Status: AC
  Administered 2024-02-05: 1000 mg via ORAL
  Filled 2024-02-05: qty 2

## 2024-02-05 MED ORDER — METHOCARBAMOL 500 MG PO TABS
500.0000 mg | ORAL_TABLET | Freq: Two times a day (BID) | ORAL | 0 refills | Status: DC | PRN
  Filled 2024-02-06: qty 20, 10d supply, fill #0

## 2024-02-05 MED ORDER — LIDOCAINE 5 % EX PTCH
1.0000 | MEDICATED_PATCH | CUTANEOUS | 0 refills | Status: DC
  Filled 2024-02-05: qty 30, 30d supply, fill #0

## 2024-02-05 MED ORDER — LIDOCAINE 5 % EX PTCH
1.0000 | MEDICATED_PATCH | CUTANEOUS | 0 refills | Status: AC
  Filled 2024-02-06: qty 30, 30d supply, fill #0

## 2024-02-05 MED ORDER — DIAZEPAM 5 MG/ML IJ SOLN
5.0000 mg | Freq: Once | INTRAMUSCULAR | Status: AC
  Administered 2024-02-05: 5 mg via INTRAMUSCULAR
  Filled 2024-02-05: qty 2

## 2024-02-05 NOTE — Discharge Instructions (Addendum)
 We evaluated you for your neck pain.  Your symptoms improved in the emergency department with treatment of your symptoms.  We did not see any sign of any dangerous problem like an infection or fracture.  Please take 1000 mg of Tylenol  every 6 hours as needed for pain.  We have also prescribed you lidocaine  patch which you can apply to your neck.  We have prescribed you a small amount of a muscle relaxer.  You have to be very careful when taking this medicine as it can make you drowsy and at high risk of falling.  You cannot drive when you take this medicine and you cannot mix it with alcohol.  If it does not help, do not take it.  Please follow-up closely with your primary doctor and please return for any new or worsening symptoms such as fevers or chills, headaches, numbness or tingling, weakness, vision changes, trouble swallowing, or any other new symptoms.

## 2024-02-05 NOTE — ED Triage Notes (Signed)
 Pt presents to ED from home C/O neck stiffness and pain since waking this AM. Pt states , "I went to [employer] last night to eat and I musta had some bad chicken cuz that was all I did last night before I went to bed and woke up like this."

## 2024-02-05 NOTE — ED Provider Notes (Signed)
 [name] EM[facility] AT [facility] Provider Note  CSN: [id number] Arrival date & time: 02/05/24 1606  Chief Complaint(s) Torticollis  HPI Richard Osborne is a 89 y.o. male history of hypertension, hyperlipidemia presenting to the emergency department with neck pain.  Patient reports that he woke up and felt pain in his neck.  Reports pain with moving his neck from side-to-side.  No injuries.  No numbness or tingling, weakness, headache, vision changes, trouble walking, nausea, vomiting, tinnitus, any other new symptoms.  He is concerned that he may have been "poisoned" by eating at [facility]   Past Medical History Past Medical History:  Diagnosis Date   High cholesterol    Hypertension    Meniere's disease    Prostate cancer [facility])    Patient Active Problem List   Diagnosis Date Noted   Pseudogout of left knee 06/01/2021   Home Medication(s) Prior to Admission medications   Medication Sig Start Date End Date Taking? Authorizing Provider  lidocaine  (LIDODERM ) 5 % Place 1 patch onto the skin daily. Remove & Discard patch within 12 hours or as directed by MD 02/05/24  Yes Mordecai Applebaum, MD  methocarbamol (ROBAXIN) 500 MG tablet Take 1 tablet (500 mg total) by mouth 2 (two) times daily as needed for muscle spasms. 02/05/24  Yes Mordecai Applebaum, MD  acetaminophen  (TYLENOL ) 500 MG tablet Take 2 tablets (1,000 mg total) by mouth every 6 (six) hours as needed for moderate pain. 02/08/21   Little, Hebert Littler, MD  cetirizine (ZYRTEC) 5 MG tablet Take 1 tablet (5 mg total) by mouth daily. 02/03/24 03/04/24  Teddi Favors, DO  diclofenac  Sodium (VOLTAREN ) 1 % GEL Apply 4 g topically 4 (four) times daily. To affected joint. 06/01/21   Margaree Shark, MD  diphenhydrAMINE  (BENADRYL ) 25 MG tablet Take 1 tablet (25 mg total) by mouth every 6 (six) hours as needed for itching (Rash). 09/21/13   Piepenbrink, Bridgette Campus, PA-C  docusate sodium  (COLACE) 100 MG capsule Take 1  capsule (100 mg total) by mouth every 12 (twelve) hours. 09/20/23   Mesner, Jason, MD  polyethylene glycol (MIRALAX  / GLYCOLAX ) 17 g packet Take 17 g by mouth daily. 09/20/23   Mesner, Reymundo Caulk, MD  predniSONE  (DELTASONE ) 20 MG tablet Take 2 tablets (40 mg total) by mouth daily. 02/08/21   Little, Hebert Littler, MD  tamsulosin (FLOMAX) 0.4 MG CAPS capsule Take 0.4 mg by mouth.    [provider]  valACYclovir  (VALTREX ) 1000 MG tablet Take 1 tablet (1,000 mg total) by mouth 3 (three) times daily. 02/16/21   Albertus Hughs, DO                                                                                                                                    Past Surgical History Past Surgical History:  Procedure Laterality Date   otic nerve surgery     PACEMAKER IMPLANT  Family History History reviewed. No pertinent family history.  Social History Social History   Tobacco Use   Smoking status: Never   Smokeless tobacco: Never  Substance Use Topics   Alcohol use: No   Drug use: No   Allergies Patient has no known allergies.  Review of Systems Review of Systems  All other systems reviewed and are negative.   Physical Exam Vital Signs  I have reviewed the triage vital signs BP (!) 170/90 (BP Location: Left Arm)   Pulse 89   Temp 98.3 F (36.8 C)   Resp 20   Ht 5\' 10"  (1.778 m)   Wt 73.5 kg   SpO2 100%   BMI 23.24 kg/m  Physical Exam Vitals and nursing note reviewed.  Constitutional:      General: He is not in acute distress.    Appearance: Normal appearance.  HENT:     Mouth/Throat:     Mouth: Mucous membranes are moist.  Eyes:     Conjunctiva/sclera: Conjunctivae normal.  Neck:     Comments: Painful lateral motion and extension/flexion of the neck.  Tenderness over the left trapezius muscle Cardiovascular:     Rate and Rhythm: Normal rate and regular rhythm.  Pulmonary:     Effort: Pulmonary effort is normal. No respiratory distress.     Breath sounds: Normal  breath sounds.  Abdominal:     General: Abdomen is flat.     Palpations: Abdomen is soft.     Tenderness: There is no abdominal tenderness.  Musculoskeletal:     Right lower leg: No edema.     Left lower leg: No edema.  Skin:    General: Skin is warm and dry.     Capillary Refill: Capillary refill takes less than 2 seconds.  Neurological:     Mental Status: He is alert and oriented to person, place, and time. Mental status is at baseline.     Comments: Cranial nerves II through XII intact, strength 5 out of 5 in the bilateral upper and lower extremities, no sensory deficit to light touch, no dysmetria on finger-nose-finger testing, ambulatory with steady gait.  Psychiatric:        Mood and Affect: Mood normal.        Behavior: Behavior normal.     ED Results and Treatments Labs (all labs ordered are listed, but only abnormal results are displayed) Labs Reviewed  BASIC METABOLIC PANEL WITH GFR - Abnormal; Notable for the following components:      Result Value   CO2 21 (*)    Creatinine, Ser 1.60 (*)    GFR, Estimated 41 (*)    Anion gap 16 (*)    All other components within normal limits  CBC WITH DIFFERENTIAL/PLATELET - Abnormal; Notable for the following components:   Monocytes Absolute 1.6 (*)    All other components within normal limits  Radiology No results found.  Pertinent labs & imaging results that were available during my care of the patient were reviewed by me and considered in my medical decision making (see MDM for details).  Medications Ordered in ED Medications  diazepam (VALIUM) injection 5 mg (5 mg Intramuscular Given 02/05/24 1739)  acetaminophen  (TYLENOL ) tablet 1,000 mg (1,000 mg Oral Given 02/05/24 1739)                                                                                                                                      Procedures Procedures  (including critical care time)  Medical Decision Making / ED Course   MDM:  89 year old presenting to the emergency department with neck pain.  Patient overall well-appearing.  neurologic examination is reassuring.  No fever, tachycardia.  Symptoms seem most consistent with muscle spasm or torticollis.  Will give Valium, Tylenol , reassess.  Patient reports he can have someone pick him up.  Will check some basic labs as well.  Consider dangerous cause of neck pain such as vascular injury, meningitis, fracture, but patient afebrile, neurologic examination is reassuring, no midline tenderness, no injuries.  Will reassess.  Patient was in ER few days ago with fever at that time had had fever few days previous to ER visit few days ago.  He denies any ongoing URI symptoms that he was endorsing previously.    Clinical Course as of 02/05/24 1920  Mon Feb 05, 2024  1610 Symptoms are much improved after received Tylenol  and Valium.  Patient's neck range of motion is improved.  He can move it up and down and side-to-side without difficulty.  He reports he feels better.  Suspect muscle spasm.  Doubt dangerous process like meningitis or vascular problem.  Discussed self-care for symptoms at home.  Gave very small amount of muscle relaxer and discussed with the patient and family it is a high risk medication in elderly and he must be very careful in taking it to avoid falls, avoid driving, do not mix with alcohol. Will discharge patient to home. All questions answered. Patient comfortable with plan of discharge. Return precautions discussed with patient and specified on the after visit summary.  [WS]    Clinical Course User Index [WS] Isaiah Marc, Dozier Genre, MD     Additional history obtained: -Additional history obtained from family -External records from outside source obtained and reviewed including: Chart review including previous notes, labs, imaging, consultation notes  including prior notes    Lab Tests: -I ordered, reviewed, and interpreted labs.   The pertinent results include:   Labs Reviewed  BASIC METABOLIC PANEL WITH GFR - Abnormal; Notable for the following components:      Result Value   CO2 21 (*)    Creatinine, Ser 1.60 (*)    GFR, Estimated 41 (*)    Anion gap 16 (*)    All other components within normal limits  CBC WITH DIFFERENTIAL/PLATELET - Abnormal; Notable for the following components:   Monocytes Absolute 1.6 (*)    All other components within normal limits    Notable for mild CKD, stable    Medicines ordered and prescription drug management: Meds ordered this encounter  Medications   diazepam (VALIUM) injection 5 mg   acetaminophen  (TYLENOL ) tablet 1,000 mg   methocarbamol (ROBAXIN) 500 MG tablet    Sig: Take 1 tablet (500 mg total) by mouth 2 (two) times daily as needed for muscle spasms.    Dispense:  20 tablet    Refill:  0   lidocaine  (LIDODERM ) 5 %    Sig: Place 1 patch onto the skin daily. Remove & Discard patch within 12 hours or as directed by MD    Dispense:  30 patch    Refill:  0    -I have reviewed the patients home medicines and have made adjustments as needed  Social Determinants of Health:  Diagnosis or treatment significantly limited by social determinants of health: lives alone   Reevaluation: After the interventions noted above, I reevaluated the patient and found that their symptoms have improved  Co morbidities that complicate the patient evaluation  Past Medical History:  Diagnosis Date   High cholesterol    Hypertension    Meniere's disease    Prostate cancer (HCC)       Dispostion: Disposition decision including need for hospitalization was considered, and patient discharged from emergency department.    Final Clinical Impression(s) / ED Diagnoses Final diagnoses:  Muscle spasms of neck     This chart was dictated using voice recognition software.  Despite best efforts to  proofread,  errors can occur which can change the documentation meaning.    Mordecai Applebaum, MD 02/05/24 [place]

## 2024-02-06 ENCOUNTER — Other Ambulatory Visit (HOSPITAL_BASED_OUTPATIENT_CLINIC_OR_DEPARTMENT_OTHER): Payer: Self-pay

## 2024-02-08 ENCOUNTER — Emergency Department (HOSPITAL_BASED_OUTPATIENT_CLINIC_OR_DEPARTMENT_OTHER)
Admission: EM | Admit: 2024-02-08 | Discharge: 2024-02-08 | Disposition: A | Discharge status: Home / Self Care | Attending: Emergency Medicine | Admitting: Emergency Medicine

## 2024-02-08 ENCOUNTER — Emergency Department (HOSPITAL_BASED_OUTPATIENT_CLINIC_OR_DEPARTMENT_OTHER)

## 2024-02-08 ENCOUNTER — Other Ambulatory Visit: Payer: Self-pay

## 2024-02-08 ENCOUNTER — Other Ambulatory Visit (HOSPITAL_BASED_OUTPATIENT_CLINIC_OR_DEPARTMENT_OTHER): Payer: Self-pay

## 2024-02-08 DIAGNOSIS — R0602 Shortness of breath: Secondary | ICD-10-CM | POA: Insufficient documentation

## 2024-02-08 DIAGNOSIS — X58XXXA Exposure to other specified factors, initial encounter: Secondary | ICD-10-CM | POA: Insufficient documentation

## 2024-02-08 DIAGNOSIS — S161XXA Strain of muscle, fascia and tendon at neck level, initial encounter: Secondary | ICD-10-CM | POA: Insufficient documentation

## 2024-02-08 DIAGNOSIS — I10 Essential (primary) hypertension: Secondary | ICD-10-CM | POA: Insufficient documentation

## 2024-02-08 DIAGNOSIS — R059 Cough, unspecified: Secondary | ICD-10-CM | POA: Insufficient documentation

## 2024-02-08 DIAGNOSIS — Z8546 Personal history of malignant neoplasm of prostate: Secondary | ICD-10-CM | POA: Insufficient documentation

## 2024-02-08 DIAGNOSIS — M6282 Rhabdomyolysis: Secondary | ICD-10-CM | POA: Diagnosis not present

## 2024-02-08 LAB — CBC WITH DIFFERENTIAL/PLATELET
Abs Immature Granulocytes: 0.05 10*3/uL (ref 0.00–0.07)
Basophils Absolute: 0 10*3/uL (ref 0.0–0.1)
Basophils Relative: 0 %
Eosinophils Absolute: 0 10*3/uL (ref 0.0–0.5)
Eosinophils Relative: 0 %
HCT: 36.2 % — ABNORMAL LOW (ref 39.0–52.0)
Hemoglobin: 11.9 g/dL — ABNORMAL LOW (ref 13.0–17.0)
Immature Granulocytes: 0 %
Lymphocytes Relative: 8 %
Lymphs Abs: 1 10*3/uL (ref 0.7–4.0)
MCH: 29.8 pg (ref 26.0–34.0)
MCHC: 32.9 g/dL (ref 30.0–36.0)
MCV: 90.7 fL (ref 80.0–100.0)
Monocytes Absolute: 2.1 10*3/uL — ABNORMAL HIGH (ref 0.1–1.0)
Monocytes Relative: 16 %
Neutro Abs: 9.8 10*3/uL — ABNORMAL HIGH (ref 1.7–7.7)
Neutrophils Relative %: 76 %
Platelets: 198 10*3/uL (ref 150–400)
RBC: 3.99 MIL/uL — ABNORMAL LOW (ref 4.22–5.81)
RDW: 12.4 % (ref 11.5–15.5)
WBC: 13 10*3/uL — ABNORMAL HIGH (ref 4.0–10.5)
nRBC: 0 % (ref 0.0–0.2)

## 2024-02-08 LAB — COMPREHENSIVE METABOLIC PANEL WITH GFR
ALT: 10 U/L (ref 0–44)
AST: 22 U/L (ref 15–41)
Albumin: 4.2 g/dL (ref 3.5–5.0)
Alkaline Phosphatase: 54 U/L (ref 38–126)
Anion gap: 11 (ref 5–15)
BUN: 20 mg/dL (ref 8–23)
CO2: 25 mmol/L (ref 22–32)
Calcium: 9.9 mg/dL (ref 8.9–10.3)
Chloride: 103 mmol/L (ref 98–111)
Creatinine, Ser: 1.51 mg/dL — ABNORMAL HIGH (ref 0.61–1.24)
GFR, Estimated: 44 mL/min — ABNORMAL LOW (ref >60)
Glucose, Bld: 143 mg/dL — ABNORMAL HIGH (ref 70–99)
Potassium: 4.5 mmol/L (ref 3.5–5.1)
Sodium: 139 mmol/L (ref 135–145)
Total Bilirubin: 0.8 mg/dL (ref 0.0–1.2)
Total Protein: 7.8 g/dL (ref 6.5–8.1)

## 2024-02-08 LAB — LIPASE, BLOOD: Lipase: 33 U/L (ref 11–51)

## 2024-02-08 LAB — TROPONIN T, HIGH SENSITIVITY
Troponin T High Sensitivity: 29 ng/L — ABNORMAL HIGH (ref <19)
Troponin T High Sensitivity: 24 ng/L — ABNORMAL HIGH (ref <19)

## 2024-02-08 MED ORDER — LIDOCAINE 5 % EX PTCH
1.0000 | MEDICATED_PATCH | CUTANEOUS | Status: DC
  Administered 2024-02-08: 1 via TRANSDERMAL
  Filled 2024-02-08: qty 1

## 2024-02-08 MED ORDER — DICLOFENAC SODIUM 1 % EX GEL
2.0000 g | Freq: Four times a day (QID) | CUTANEOUS | 0 refills | Status: AC | PRN
  Filled 2024-02-08: qty 100, 25d supply, fill #0

## 2024-02-08 MED ORDER — TIZANIDINE HCL 2 MG PO TABS
2.0000 mg | ORAL_TABLET | Freq: Three times a day (TID) | ORAL | 0 refills | Status: AC | PRN
  Filled 2024-02-08: qty 20, 7d supply, fill #0

## 2024-02-08 NOTE — ED Triage Notes (Signed)
 Pt reports SOB that began yesterday. Pt reports having a low fever a few weeks ago.  Pt complains that neck pain is worsening after taking muscle relaxer

## 2024-02-08 NOTE — Discharge Instructions (Signed)
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
## 2024-02-09 ENCOUNTER — Encounter (HOSPITAL_COMMUNITY): Payer: Self-pay

## 2024-02-09 ENCOUNTER — Emergency Department (HOSPITAL_COMMUNITY)

## 2024-02-09 ENCOUNTER — Inpatient Hospital Stay (HOSPITAL_COMMUNITY)
Admission: EM | Admit: 2024-02-09 | Discharge: 2024-02-16 | DRG: 557 | Disposition: A | Discharge status: Home / Self Care | Attending: Family Medicine | Admitting: Family Medicine

## 2024-02-09 DIAGNOSIS — F03A2 Unspecified dementia, mild, with psychotic disturbance: Secondary | ICD-10-CM | POA: Diagnosis present

## 2024-02-09 DIAGNOSIS — Y92009 Unspecified place in unspecified non-institutional (private) residence as the place of occurrence of the external cause: Secondary | ICD-10-CM

## 2024-02-09 DIAGNOSIS — M11261 Other chondrocalcinosis, right knee: Secondary | ICD-10-CM | POA: Diagnosis present

## 2024-02-09 DIAGNOSIS — I129 Hypertensive chronic kidney disease with stage 1 through stage 4 chronic kidney disease, or unspecified chronic kidney disease: Secondary | ICD-10-CM | POA: Diagnosis present

## 2024-02-09 DIAGNOSIS — R7989 Other specified abnormal findings of blood chemistry: Secondary | ICD-10-CM

## 2024-02-09 DIAGNOSIS — Z950 Presence of cardiac pacemaker: Secondary | ICD-10-CM | POA: Diagnosis present

## 2024-02-09 DIAGNOSIS — Z8551 Personal history of malignant neoplasm of bladder: Secondary | ICD-10-CM

## 2024-02-09 DIAGNOSIS — M170 Bilateral primary osteoarthritis of knee: Secondary | ICD-10-CM | POA: Diagnosis present

## 2024-02-09 DIAGNOSIS — G928 Other toxic encephalopathy: Secondary | ICD-10-CM | POA: Diagnosis present

## 2024-02-09 DIAGNOSIS — E8809 Other disorders of plasma-protein metabolism, not elsewhere classified: Secondary | ICD-10-CM | POA: Diagnosis present

## 2024-02-09 DIAGNOSIS — N1832 Chronic kidney disease, stage 3b: Secondary | ICD-10-CM | POA: Diagnosis present

## 2024-02-09 DIAGNOSIS — W19XXXA Unspecified fall, initial encounter: Secondary | ICD-10-CM | POA: Diagnosis present

## 2024-02-09 DIAGNOSIS — C679 Malignant neoplasm of bladder, unspecified: Secondary | ICD-10-CM

## 2024-02-09 DIAGNOSIS — N400 Enlarged prostate without lower urinary tract symptoms: Secondary | ICD-10-CM | POA: Diagnosis present

## 2024-02-09 DIAGNOSIS — E7800 Pure hypercholesterolemia, unspecified: Secondary | ICD-10-CM | POA: Diagnosis present

## 2024-02-09 DIAGNOSIS — K219 Gastro-esophageal reflux disease without esophagitis: Secondary | ICD-10-CM | POA: Diagnosis present

## 2024-02-09 DIAGNOSIS — I10 Essential (primary) hypertension: Secondary | ICD-10-CM | POA: Diagnosis present

## 2024-02-09 DIAGNOSIS — N179 Acute kidney failure, unspecified: Secondary | ICD-10-CM | POA: Diagnosis present

## 2024-02-09 DIAGNOSIS — R531 Weakness: Secondary | ICD-10-CM | POA: Diagnosis present

## 2024-02-09 DIAGNOSIS — Z8546 Personal history of malignant neoplasm of prostate: Secondary | ICD-10-CM

## 2024-02-09 DIAGNOSIS — M6282 Rhabdomyolysis: Principal | ICD-10-CM | POA: Diagnosis present

## 2024-02-09 DIAGNOSIS — S161XXA Strain of muscle, fascia and tendon at neck level, initial encounter: Secondary | ICD-10-CM | POA: Diagnosis present

## 2024-02-09 DIAGNOSIS — E785 Hyperlipidemia, unspecified: Secondary | ICD-10-CM | POA: Diagnosis present

## 2024-02-09 DIAGNOSIS — M25461 Effusion, right knee: Secondary | ICD-10-CM | POA: Diagnosis present

## 2024-02-09 DIAGNOSIS — M160 Bilateral primary osteoarthritis of hip: Secondary | ICD-10-CM | POA: Diagnosis present

## 2024-02-09 DIAGNOSIS — M542 Cervicalgia: Secondary | ICD-10-CM

## 2024-02-09 DIAGNOSIS — R6510 Systemic inflammatory response syndrome (SIRS) of non-infectious origin without acute organ dysfunction: Secondary | ICD-10-CM | POA: Diagnosis present

## 2024-02-09 DIAGNOSIS — M5136 Other intervertebral disc degeneration, lumbar region with discogenic back pain only: Secondary | ICD-10-CM | POA: Diagnosis present

## 2024-02-09 DIAGNOSIS — R413 Other amnesia: Secondary | ICD-10-CM | POA: Diagnosis present

## 2024-02-09 DIAGNOSIS — Z79899 Other long term (current) drug therapy: Secondary | ICD-10-CM

## 2024-02-09 DIAGNOSIS — M109 Gout, unspecified: Secondary | ICD-10-CM | POA: Diagnosis present

## 2024-02-09 DIAGNOSIS — S8001XA Contusion of right knee, initial encounter: Secondary | ICD-10-CM | POA: Diagnosis present

## 2024-02-09 DIAGNOSIS — H8109 Meniere's disease, unspecified ear: Secondary | ICD-10-CM | POA: Diagnosis present

## 2024-02-09 DIAGNOSIS — Z1152 Encounter for screening for COVID-19: Secondary | ICD-10-CM

## 2024-02-09 DIAGNOSIS — Z888 Allergy status to other drugs, medicaments and biological substances status: Secondary | ICD-10-CM

## 2024-02-09 DIAGNOSIS — I480 Paroxysmal atrial fibrillation: Secondary | ICD-10-CM | POA: Diagnosis present

## 2024-02-09 DIAGNOSIS — I4430 Unspecified atrioventricular block: Secondary | ICD-10-CM | POA: Diagnosis present

## 2024-02-09 LAB — URINALYSIS, W/ REFLEX TO CULTURE (INFECTION SUSPECTED)
Bacteria, UA: NONE SEEN
Bilirubin Urine: NEGATIVE
Glucose, UA: NEGATIVE mg/dL
Ketones, ur: 5 mg/dL — AB
Leukocytes,Ua: NEGATIVE
Nitrite: NEGATIVE
Protein, ur: >=300 mg/dL — AB
Specific Gravity, Urine: 1.015 (ref 1.005–1.030)
pH: 5 (ref 5.0–8.0)

## 2024-02-09 LAB — CBC WITH DIFFERENTIAL/PLATELET
Abs Immature Granulocytes: 0.07 10*3/uL (ref 0.00–0.07)
Basophils Absolute: 0 10*3/uL (ref 0.0–0.1)
Basophils Relative: 0 %
Eosinophils Absolute: 0 10*3/uL (ref 0.0–0.5)
Eosinophils Relative: 0 %
HCT: 39 % (ref 39.0–52.0)
Hemoglobin: 12.6 g/dL — ABNORMAL LOW (ref 13.0–17.0)
Immature Granulocytes: 0 %
Lymphocytes Relative: 5 %
Lymphs Abs: 0.8 10*3/uL (ref 0.7–4.0)
MCH: 29.6 pg (ref 26.0–34.0)
MCHC: 32.3 g/dL (ref 30.0–36.0)
MCV: 91.8 fL (ref 80.0–100.0)
Monocytes Absolute: 2.6 10*3/uL — ABNORMAL HIGH (ref 0.1–1.0)
Monocytes Relative: 15 %
Neutro Abs: 13.5 10*3/uL — ABNORMAL HIGH (ref 1.7–7.7)
Neutrophils Relative %: 80 %
Platelets: 246 10*3/uL (ref 150–400)
RBC: 4.25 MIL/uL (ref 4.22–5.81)
RDW: 12.6 % (ref 11.5–15.5)
WBC: 17 10*3/uL — ABNORMAL HIGH (ref 4.0–10.5)
nRBC: 0 % (ref 0.0–0.2)

## 2024-02-09 LAB — COMPREHENSIVE METABOLIC PANEL WITH GFR
ALT: 19 U/L (ref 0–44)
AST: 63 U/L — ABNORMAL HIGH (ref 15–41)
Albumin: 3.4 g/dL — ABNORMAL LOW (ref 3.5–5.0)
Alkaline Phosphatase: 52 U/L (ref 38–126)
Anion gap: 12 (ref 5–15)
BUN: 27 mg/dL — ABNORMAL HIGH (ref 8–23)
CO2: 20 mmol/L — ABNORMAL LOW (ref 22–32)
Calcium: 9.3 mg/dL (ref 8.9–10.3)
Chloride: 104 mmol/L (ref 98–111)
Creatinine, Ser: 1.83 mg/dL — ABNORMAL HIGH (ref 0.61–1.24)
GFR, Estimated: 35 mL/min — ABNORMAL LOW (ref >60)
Glucose, Bld: 236 mg/dL — ABNORMAL HIGH (ref 70–99)
Potassium: 4.2 mmol/L (ref 3.5–5.1)
Sodium: 136 mmol/L (ref 135–145)
Total Bilirubin: 1.3 mg/dL — ABNORMAL HIGH (ref 0.0–1.2)
Total Protein: 7.7 g/dL (ref 6.5–8.1)

## 2024-02-09 LAB — CK: Total CK: 1652 U/L — ABNORMAL HIGH (ref 49–397)

## 2024-02-09 LAB — TROPONIN I (HIGH SENSITIVITY): Troponin I (High Sensitivity): 99 ng/L — ABNORMAL HIGH (ref <18)

## 2024-02-09 LAB — I-STAT CG4 LACTIC ACID, ED: Lactic Acid, Venous: 2.1 mmol/L (ref 0.5–1.9)

## 2024-02-09 MED ORDER — ACETAMINOPHEN 500 MG PO TABS
1000.0000 mg | ORAL_TABLET | Freq: Once | ORAL | Status: AC
  Administered 2024-02-10: 1000 mg via ORAL
  Filled 2024-02-09: qty 2

## 2024-02-09 MED ORDER — SODIUM CHLORIDE 0.9 % IV SOLN
1.0000 g | Freq: Once | INTRAVENOUS | Status: AC
  Administered 2024-02-10: 1 g via INTRAVENOUS
  Filled 2024-02-09: qty 10

## 2024-02-09 MED ORDER — SODIUM CHLORIDE 0.9 % IV SOLN
500.0000 mg | Freq: Once | INTRAVENOUS | Status: AC
  Administered 2024-02-10: 500 mg via INTRAVENOUS
  Filled 2024-02-09: qty 5

## 2024-02-09 MED ORDER — SODIUM CHLORIDE 0.9 % IV BOLUS
1000.0000 mL | Freq: Once | INTRAVENOUS | Status: AC
  Administered 2024-02-10: 1000 mL via INTRAVENOUS

## 2024-02-09 NOTE — ED Notes (Signed)
 C collar placed in triage

## 2024-02-09 NOTE — ED Provider Notes (Incomplete)
 [facility] Provider Note   CSN: [id number] Arrival date & time: 02/09/24  2023     History {Add pertinent medical, surgical, social history, OB history to HPI:1} Chief Complaint  Patient presents with  . Fall    Richard Osborne is a 89 y.o. male.  The history is provided by the patient and medical records. No language interpreter was used.  Fall     89 year old male history of prostate cancer, hypertension, hypercholesterolemia, Mnire's disease brought in by family member from home after being found on the floor.  Patient report yesterday as he was walking around house, his legs gave out and he fell to the ground.  He denies hitting his head or have any loss of consciousness but he was unable to get off of the ground due to having significant pain to his right knee.  He states he was on the ground for 7 to 8 hours until his niece and nephew came over and assist him.  Furthermore, patient states he has been having a productive cough for the past 2 weeks, had a fever, neck pain and bilateral shoulder pain.  He did have some headache earlier but that has since resolved.  Neck pain is an ongoing issue for several weeks.  He does not endorse any significant chest pain or shortness of breath denies any abdominal pain no nausea vomiting diarrhea or dysuria.  Appetite is normal.  Patient lives at home by himself, normally use a walker to walk  Home Medications Prior to Admission medications   Medication Sig Start Date End Date Taking? Authorizing Provider  acetaminophen  (TYLENOL ) 500 MG tablet Take 2 tablets (1,000 mg total) by mouth every 6 (six) hours as needed for moderate pain. 02/08/21   Little, Hebert Littler, MD  cetirizine  (ZYRTEC ) 5 MG tablet Take 1 tablet (5 mg total) by mouth daily. 02/03/24 03/04/24  Teddi Favors, DO  diclofenac  Sodium (VOLTAREN ) 1 % GEL Apply 2 g topically 4 (four) times daily as needed. 02/08/24   Long, Shereen Dike, MD   diphenhydrAMINE  (BENADRYL ) 25 MG tablet Take 1 tablet (25 mg total) by mouth every 6 (six) hours as needed for itching (Rash). 09/21/13   Piepenbrink, Bridgette Campus, PA-C  docusate sodium  (COLACE) 100 MG capsule Take 1 capsule (100 mg total) by mouth every 12 (twelve) hours. 09/20/23   Mesner, Reymundo Caulk, MD  lidocaine  (LIDODERM ) 5 % Place 1 patch onto the skin daily. Remove & Discard patch within 12 hours or as directed by MD 02/05/24   Mordecai Applebaum, MD  polyethylene glycol (MIRALAX  / GLYCOLAX ) 17 g packet Take 17 g by mouth daily. 09/20/23   Mesner, Reymundo Caulk, MD  predniSONE  (DELTASONE ) 20 MG tablet Take 2 tablets (40 mg total) by mouth daily. 02/08/21   Little, Hebert Littler, MD  tamsulosin (FLOMAX) 0.4 MG CAPS capsule Take 0.4 mg by mouth.    [provider]  tiZANidine  (ZANAFLEX ) 2 MG tablet Take 1 tablet (2 mg total) by mouth every 8 (eight) hours as needed for muscle spasms. 02/08/24   Long, Shereen Dike, MD  valACYclovir  (VALTREX ) 1000 MG tablet Take 1 tablet (1,000 mg total) by mouth 3 (three) times daily. 02/16/21   Floyd, Dan, DO      Allergies    Patient has no known allergies.    Review of Systems   Review of Systems  All other systems reviewed and are negative.   Physical Exam Updated Vital Signs BP (!) 146/77 (  BP Location: Left Arm)   Pulse 96   Temp 99.7 F (37.6 C)   Resp (!) 21   Ht 5\' 10"  (1.778 m)   Wt 73.5 kg   SpO2 98%   BMI 23.24 kg/m  Physical Exam Constitutional:      General: He is not in acute distress.    Appearance: He is well-developed.  HENT:     Head: Normocephalic and atraumatic.  Eyes:     Extraocular Movements: Extraocular movements intact.     Conjunctiva/sclera: Conjunctivae normal.     Pupils: Pupils are equal, round, and reactive to light.  Cardiovascular:     Rate and Rhythm: Normal rate and regular rhythm.     Pulses: Normal pulses.     Heart sounds: Normal heart sounds.  Pulmonary:     Effort: Pulmonary effort is normal.     Breath  sounds: Normal breath sounds.  Abdominal:     Palpations: Abdomen is soft.     Tenderness: There is no abdominal tenderness.  Musculoskeletal:        General: Tenderness (Right knee: Knee is warm and tender to palpation and decreased range of motion.  No deformity.) present.     Cervical back: Normal range of motion and neck supple. Tenderness (Tenderness to right cervical paraspinal muscle without significant midline spine tenderness) present.  Skin:    Findings: No rash.  Neurological:     Mental Status: He is alert and oriented to person, place, and time.     ED Results / Procedures / Treatments   Labs (all labs ordered are listed, but only abnormal results are displayed) Labs Reviewed  CK - Abnormal; Notable for the following components:      Result Value   Total CK 1,652 (*)    All other components within normal limits  COMPREHENSIVE METABOLIC PANEL WITH GFR - Abnormal; Notable for the following components:   CO2 20 (*)    Glucose, Bld 236 (*)    BUN 27 (*)    Creatinine, Ser 1.83 (*)    Albumin 3.4 (*)    AST 63 (*)    Total Bilirubin 1.3 (*)    GFR, Estimated 35 (*)    All other components within normal limits  CBC WITH DIFFERENTIAL/PLATELET - Abnormal; Notable for the following components:   WBC 17.0 (*)    Hemoglobin 12.6 (*)    Neutro Abs 13.5 (*)    Monocytes Absolute 2.6 (*)    All other components within normal limits  I-STAT CG4 LACTIC ACID, ED - Abnormal; Notable for the following components:   Lactic Acid, Venous 2.1 (*)    All other components within normal limits  TROPONIN I (HIGH SENSITIVITY) - Abnormal; Notable for the following components:   Troponin I (High Sensitivity) 99 (*)    All other components within normal limits  CULTURE, BLOOD (ROUTINE X 2)  CULTURE, BLOOD (ROUTINE X 2)  URINALYSIS, W/ REFLEX TO CULTURE (INFECTION SUSPECTED)  I-STAT CG4 LACTIC ACID, ED  TROPONIN I (HIGH SENSITIVITY)    EKG None  Radiology DG Chest 2 View Result  Date: 02/09/2024 CLINICAL DATA:  Cough.  Patient fell.  Coughing up mucus. EXAM: CHEST - 2 VIEW COMPARISON:  02/08/2024 FINDINGS: Cardiac pacemaker. Shallow inspiration. Heart size and pulmonary vascularity are normal. Lungs are clear. No pleural effusion or pneumothorax. Mediastinal contours appear intact. Degenerative changes in the spine and shoulders. IMPRESSION: Shallow inspiration.  No evidence of active pulmonary disease. Electronically Signed  By: Boyce Byes M.D.   On: 02/09/2024 21:29   DG Chest 2 View Result Date: 02/08/2024 CLINICAL DATA:  Dyspnea EXAM: CHEST - 2 VIEW COMPARISON:  February 03, 2024, October 28, 2023 FINDINGS: Mild cardiomegaly. Left chest pacemaker with leads in the right atrium and right ventricle. Unchanged fine reticular opacities in the periphery of both lung bases. No new airspace consolidation, pleural effusion, or pneumothorax. Multilevel thoracic osteophytosis. IMPRESSION: No acute cardiopulmonary abnormality. Electronically Signed   By: Rance Burrows M.D.   On: 02/08/2024 11:21   CT Head Wo Contrast Result Date: 02/08/2024 CLINICAL DATA:  Head trauma, minor (Age >= 65y); Neck trauma (Age >= 65y) EXAM: CT HEAD WITHOUT CONTRAST CT CERVICAL SPINE WITHOUT CONTRAST TECHNIQUE: Multidetector CT imaging of the head and cervical spine was performed following the standard protocol without intravenous contrast. Multiplanar CT image reconstructions of the cervical spine were also generated. RADIATION DOSE REDUCTION: This exam was performed according to the departmental dose-optimization program which includes automated exposure control, adjustment of the mA and/or kV according to patient size and/or use of iterative reconstruction technique. COMPARISON:  None Available. FINDINGS: CT HEAD FINDINGS Brain: No evidence of acute infarction, hemorrhage, hydrocephalus, extra-axial collection or mass lesion/mass effect. Patchy white matter hypodensities, nonspecific but compatible with  chronic microvascular ischemic disease. Vascular: No hyperdense vessel.  Calcific atherosclerosis. Skull: No acute fracture. Sinuses/Orbits: Clear sinuses.  No acute orbital findings. Other: Left mastoidectomy. CT CERVICAL SPINE FINDINGS Alignment: No substantial sagittal subluxation. Skull base and vertebrae: Vertebral body heights are maintained. No evidence of acute fracture. Soft tissues and spinal canal: No prevertebral fluid or swelling. No visible canal hematoma. Disc levels: Multilevel degenerative change including degenerative disease and facet/uncovertebral hypertrophy with varying degrees of neural foraminal stenosis Upper chest: Visualized lung apices are clear. IMPRESSION: 1. No evidence of acute intracranial abnormality. 2. No evidence of acute fracture or traumatic malalignment in the cervical spine. Electronically Signed   By: Stevenson Elbe M.D.   On: 02/08/2024 10:32   CT Cervical Spine Wo Contrast Result Date: 02/08/2024 CLINICAL DATA:  Head trauma, minor (Age >= 65y); Neck trauma (Age >= 65y) EXAM: CT HEAD WITHOUT CONTRAST CT CERVICAL SPINE WITHOUT CONTRAST TECHNIQUE: Multidetector CT imaging of the head and cervical spine was performed following the standard protocol without intravenous contrast. Multiplanar CT image reconstructions of the cervical spine were also generated. RADIATION DOSE REDUCTION: This exam was performed according to the departmental dose-optimization program which includes automated exposure control, adjustment of the mA and/or kV according to patient size and/or use of iterative reconstruction technique. COMPARISON:  None Available. FINDINGS: CT HEAD FINDINGS Brain: No evidence of acute infarction, hemorrhage, hydrocephalus, extra-axial collection or mass lesion/mass effect. Patchy white matter hypodensities, nonspecific but compatible with chronic microvascular ischemic disease. Vascular: No hyperdense vessel.  Calcific atherosclerosis. Skull: No acute fracture.  Sinuses/Orbits: Clear sinuses.  No acute orbital findings. Other: Left mastoidectomy. CT CERVICAL SPINE FINDINGS Alignment: No substantial sagittal subluxation. Skull base and vertebrae: Vertebral body heights are maintained. No evidence of acute fracture. Soft tissues and spinal canal: No prevertebral fluid or swelling. No visible canal hematoma. Disc levels: Multilevel degenerative change including degenerative disease and facet/uncovertebral hypertrophy with varying degrees of neural foraminal stenosis Upper chest: Visualized lung apices are clear. IMPRESSION: 1. No evidence of acute intracranial abnormality. 2. No evidence of acute fracture or traumatic malalignment in the cervical spine. Electronically Signed   By: Stevenson Elbe M.D.   On: 02/08/2024 10:32    Procedures Procedures  {  Document cardiac monitor, telemetry assessment procedure when appropriate:1}  Medications Ordered in ED Medications - No data to display  ED Course/ Medical Decision Making/ A&P   {   Click here for ABCD2, HEART and other calculatorsREFRESH Note before signing :1}                              Medical Decision Making Amount and/or Complexity of Data Reviewed Labs: ordered. Radiology: ordered.  Risk OTC drugs.   BP 129/71 (BP Location: Right Arm)   Pulse 91   Temp 99.7 F (37.6 C)   Resp (!) 27   Ht 5\' 10"  (1.778 m)   Wt 73.5 kg   SpO2 98%   BMI 23.24 kg/m   61:58 PM  89 year old male history of prostate cancer, hypertension, hypercholesterolemia, Mnire's disease brought in by family member from home after being found on the floor.  Patient report yesterday as he was walking around house, his legs gave out and he fell to the ground.  He denies hitting his head or have any loss of consciousness but he was unable to get off of the ground due to having significant pain to his right knee.  He states he was on the ground for 7 to 8 hours until his niece and nephew came over and assist him.   Furthermore, patient states he has been having a productive cough for the past 2 weeks, had a fever, neck pain and bilateral shoulder pain.  He did have some headache earlier but that has since resolved.  Neck pain is an ongoing issue for several weeks.  He does not endorse any significant chest pain or shortness of breath denies any abdominal pain no nausea vomiting diarrhea or dysuria.  Appetite is normal.  Patient lives at home by himself, normally use a walker to walk  On exam patient is laying in bed appears to be in no acute discomfort.  He does not exhibit any signs of head trauma.  He does have some tenderness to his right cervical paraspinal muscle without significant midline spine tenderness.  He is able to range his neck without difficulty and he has no nuchal rigidity.  Heart with normal rhythm, lungs clear abdomen is soft nontender no significant tenderness along his midline spine no CVA tenderness.  Right knee is tender and warm to the touch with decreased range of motion.  X-rays ordered.  -Labs ordered, independently viewed and interpreted by me.  Labs remarkable for *** -The patient was maintained on a cardiac monitor.  I personally viewed and interpreted the cardiac monitored which showed an underlying rhythm of: *** -Imaging independently viewed and interpreted by me and I agree with radiologist's interpretation.  Result remarkable for *** -This patient presents to the ED for concern of ***, this involves an extensive number of treatment options, and is a complaint that carries with it a high risk of complications and morbidity.  The differential diagnosis includes *** -Co morbidities that complicate the patient evaluation includes *** -Treatment includes *** -Reevaluation of the patient after these medicines showed that the patient {resolved/improved/worsened:23923::"improved"} -PCP office notes or outside notes reviewed -Discussion with specialist *** -Escalation to  admission/observation considered: patients feels much better, is comfortable with discharge, and will follow up with PCP -Prescription medication considered, patient comfortable with *** -Social Determinant of Health considered which includes ***   {Document critical care time when appropriate:1} {Document review of labs and clinical decision  tools ie heart score, Chads2Vasc2 etc:1}  {Document your independent review of radiology images, and any outside records:1} {Document your discussion with family members, caretakers, and with consultants:1} {Document social determinants of health affecting pt's care:1} {Document your decision making why or why not admission, treatments were needed:1} Final Clinical Impression(s) / ED Diagnoses Final diagnoses:  None    Rx / DC Orders ED Discharge Orders     None

## 2024-02-09 NOTE — ED Triage Notes (Signed)
 Pt arrives with c/o a mechanical fall earlier today. Per family, pt was on the floor for 8-9 hours. Pt c/o neck pain, bilateral shoulder pain, and right knee pain. Pt denies LOC or hitting his head. Pt does not take blood thinners. Pt and family endorse pt having generalized weakness and cough over the past 1-2 weeks. Pt endorses productive cough.

## 2024-02-09 NOTE — ED Provider Notes (Signed)
 [facility] Provider Note   CSN: [id number] Arrival date & time: 02/09/24  2023     History {Add pertinent medical, surgical, social history, OB history to HPI:1} Chief Complaint  Patient presents with   Richard Osborne is a 89 y.o. male.  The history is provided by the patient and medical records. No language interpreter was used.  Fall     89 year old male history of prostate cancer, hypertension, hypercholesterolemia, Mnire's disease brought in by family member from home after being found on the floor.  Patient report yesterday as he was walking around house, his legs gave out and he fell to the ground.  He denies hitting his head or have any loss of consciousness but he was unable to get off of the ground due to having significant pain to his right knee.  He states he was on the ground for 7 to 8 hours until his niece and nephew came over and assist him.  Furthermore, patient states he has been having a productive cough for the past 2 weeks, had a fever, neck pain and bilateral shoulder pain.  He did have some headache earlier but that has since resolved.  Neck pain is an ongoing issue for several weeks.  He does not endorse any significant chest pain or shortness of breath denies any abdominal pain no nausea vomiting diarrhea or dysuria.  Appetite is normal.  Patient lives at home by himself, normally use a walker to walk  Home Medications Prior to Admission medications   Medication Sig Start Date End Date Taking? Authorizing Provider  acetaminophen  (TYLENOL ) 500 MG tablet Take 2 tablets (1,000 mg total) by mouth every 6 (six) hours as needed for moderate pain. 02/08/21   Little, Hebert Littler, MD  cetirizine  (ZYRTEC ) 5 MG tablet Take 1 tablet (5 mg total) by mouth daily. 02/03/24 03/04/24  Teddi Favors, DO  diclofenac  Sodium (VOLTAREN ) 1 % GEL Apply 2 g topically 4 (four) times daily as needed. 02/08/24   Long, Shereen Dike, MD   diphenhydrAMINE  (BENADRYL ) 25 MG tablet Take 1 tablet (25 mg total) by mouth every 6 (six) hours as needed for itching (Rash). 09/21/13   Piepenbrink, Bridgette Campus, PA-C  docusate sodium  (COLACE) 100 MG capsule Take 1 capsule (100 mg total) by mouth every 12 (twelve) hours. 09/20/23   Mesner, Reymundo Caulk, MD  lidocaine  (LIDODERM ) 5 % Place 1 patch onto the skin daily. Remove & Discard patch within 12 hours or as directed by MD 02/05/24   Mordecai Applebaum, MD  polyethylene glycol (MIRALAX  / GLYCOLAX ) 17 g packet Take 17 g by mouth daily. 09/20/23   Mesner, Reymundo Caulk, MD  predniSONE  (DELTASONE ) 20 MG tablet Take 2 tablets (40 mg total) by mouth daily. 02/08/21   Little, Hebert Littler, MD  tamsulosin (FLOMAX) 0.4 MG CAPS capsule Take 0.4 mg by mouth.    [provider]  tiZANidine  (ZANAFLEX ) 2 MG tablet Take 1 tablet (2 mg total) by mouth every 8 (eight) hours as needed for muscle spasms. 02/08/24   Long, Shereen Dike, MD  valACYclovir  (VALTREX ) 1000 MG tablet Take 1 tablet (1,000 mg total) by mouth 3 (three) times daily. 02/16/21   Floyd, Dan, DO      Allergies    Patient has no known allergies.    Review of Systems   Review of Systems  All other systems reviewed and are negative.   Physical Exam Updated Vital Signs BP (!) 146/77 (  BP Location: Left Arm)   Pulse 96   Temp 99.7 F (37.6 C)   Resp (!) 21   Ht 5\' 10"  (1.778 m)   Wt 73.5 kg   SpO2 98%   BMI 23.24 kg/m  Physical Exam Constitutional:      General: He is not in acute distress.    Appearance: He is well-developed.  HENT:     Head: Normocephalic and atraumatic.  Eyes:     Extraocular Movements: Extraocular movements intact.     Conjunctiva/sclera: Conjunctivae normal.     Pupils: Pupils are equal, round, and reactive to light.  Cardiovascular:     Rate and Rhythm: Normal rate and regular rhythm.     Pulses: Normal pulses.     Heart sounds: Normal heart sounds.  Pulmonary:     Effort: Pulmonary effort is normal.     Breath  sounds: Normal breath sounds.  Abdominal:     Palpations: Abdomen is soft.     Tenderness: There is no abdominal tenderness.  Musculoskeletal:        General: Tenderness (Right knee: Knee is warm and tender to palpation and decreased range of motion.  No deformity.) present.     Cervical back: Normal range of motion and neck supple. Tenderness (Tenderness to right cervical paraspinal muscle without significant midline spine tenderness) present.  Skin:    Findings: No rash.  Neurological:     Mental Status: He is alert and oriented to person, place, and time.     ED Results / Procedures / Treatments   Labs (all labs ordered are listed, but only abnormal results are displayed) Labs Reviewed  CK - Abnormal; Notable for the following components:      Result Value   Total CK 1,652 (*)    All other components within normal limits  COMPREHENSIVE METABOLIC PANEL WITH GFR - Abnormal; Notable for the following components:   CO2 20 (*)    Glucose, Bld 236 (*)    BUN 27 (*)    Creatinine, Ser 1.83 (*)    Albumin 3.4 (*)    AST 63 (*)    Total Bilirubin 1.3 (*)    GFR, Estimated 35 (*)    All other components within normal limits  CBC WITH DIFFERENTIAL/PLATELET - Abnormal; Notable for the following components:   WBC 17.0 (*)    Hemoglobin 12.6 (*)    Neutro Abs 13.5 (*)    Monocytes Absolute 2.6 (*)    All other components within normal limits  I-STAT CG4 LACTIC ACID, ED - Abnormal; Notable for the following components:   Lactic Acid, Venous 2.1 (*)    All other components within normal limits  TROPONIN I (HIGH SENSITIVITY) - Abnormal; Notable for the following components:   Troponin I (High Sensitivity) 99 (*)    All other components within normal limits  CULTURE, BLOOD (ROUTINE X 2)  CULTURE, BLOOD (ROUTINE X 2)  URINALYSIS, W/ REFLEX TO CULTURE (INFECTION SUSPECTED)  I-STAT CG4 LACTIC ACID, ED  TROPONIN I (HIGH SENSITIVITY)    EKG None  Radiology DG Chest 2 View Result  Date: 02/09/2024 CLINICAL DATA:  Cough.  Patient fell.  Coughing up mucus. EXAM: CHEST - 2 VIEW COMPARISON:  02/08/2024 FINDINGS: Cardiac pacemaker. Shallow inspiration. Heart size and pulmonary vascularity are normal. Lungs are clear. No pleural effusion or pneumothorax. Mediastinal contours appear intact. Degenerative changes in the spine and shoulders. IMPRESSION: Shallow inspiration.  No evidence of active pulmonary disease. Electronically Signed  By: Boyce Byes M.D.   On: 02/09/2024 21:29   DG Chest 2 View Result Date: 02/08/2024 CLINICAL DATA:  Dyspnea EXAM: CHEST - 2 VIEW COMPARISON:  February 03, 2024, October 28, 2023 FINDINGS: Mild cardiomegaly. Left chest pacemaker with leads in the right atrium and right ventricle. Unchanged fine reticular opacities in the periphery of both lung bases. No new airspace consolidation, pleural effusion, or pneumothorax. Multilevel thoracic osteophytosis. IMPRESSION: No acute cardiopulmonary abnormality. Electronically Signed   By: Rance Burrows M.D.   On: 02/08/2024 11:21   CT Head Wo Contrast Result Date: 02/08/2024 CLINICAL DATA:  Head trauma, minor (Age >= 65y); Neck trauma (Age >= 65y) EXAM: CT HEAD WITHOUT CONTRAST CT CERVICAL SPINE WITHOUT CONTRAST TECHNIQUE: Multidetector CT imaging of the head and cervical spine was performed following the standard protocol without intravenous contrast. Multiplanar CT image reconstructions of the cervical spine were also generated. RADIATION DOSE REDUCTION: This exam was performed according to the departmental dose-optimization program which includes automated exposure control, adjustment of the mA and/or kV according to patient size and/or use of iterative reconstruction technique. COMPARISON:  None Available. FINDINGS: CT HEAD FINDINGS Brain: No evidence of acute infarction, hemorrhage, hydrocephalus, extra-axial collection or mass lesion/mass effect. Patchy white matter hypodensities, nonspecific but compatible with  chronic microvascular ischemic disease. Vascular: No hyperdense vessel.  Calcific atherosclerosis. Skull: No acute fracture. Sinuses/Orbits: Clear sinuses.  No acute orbital findings. Other: Left mastoidectomy. CT CERVICAL SPINE FINDINGS Alignment: No substantial sagittal subluxation. Skull base and vertebrae: Vertebral body heights are maintained. No evidence of acute fracture. Soft tissues and spinal canal: No prevertebral fluid or swelling. No visible canal hematoma. Disc levels: Multilevel degenerative change including degenerative disease and facet/uncovertebral hypertrophy with varying degrees of neural foraminal stenosis Upper chest: Visualized lung apices are clear. IMPRESSION: 1. No evidence of acute intracranial abnormality. 2. No evidence of acute fracture or traumatic malalignment in the cervical spine. Electronically Signed   By: Stevenson Elbe M.D.   On: 02/08/2024 10:32   CT Cervical Spine Wo Contrast Result Date: 02/08/2024 CLINICAL DATA:  Head trauma, minor (Age >= 65y); Neck trauma (Age >= 65y) EXAM: CT HEAD WITHOUT CONTRAST CT CERVICAL SPINE WITHOUT CONTRAST TECHNIQUE: Multidetector CT imaging of the head and cervical spine was performed following the standard protocol without intravenous contrast. Multiplanar CT image reconstructions of the cervical spine were also generated. RADIATION DOSE REDUCTION: This exam was performed according to the departmental dose-optimization program which includes automated exposure control, adjustment of the mA and/or kV according to patient size and/or use of iterative reconstruction technique. COMPARISON:  None Available. FINDINGS: CT HEAD FINDINGS Brain: No evidence of acute infarction, hemorrhage, hydrocephalus, extra-axial collection or mass lesion/mass effect. Patchy white matter hypodensities, nonspecific but compatible with chronic microvascular ischemic disease. Vascular: No hyperdense vessel.  Calcific atherosclerosis. Skull: No acute fracture.  Sinuses/Orbits: Clear sinuses.  No acute orbital findings. Other: Left mastoidectomy. CT CERVICAL SPINE FINDINGS Alignment: No substantial sagittal subluxation. Skull base and vertebrae: Vertebral body heights are maintained. No evidence of acute fracture. Soft tissues and spinal canal: No prevertebral fluid or swelling. No visible canal hematoma. Disc levels: Multilevel degenerative change including degenerative disease and facet/uncovertebral hypertrophy with varying degrees of neural foraminal stenosis Upper chest: Visualized lung apices are clear. IMPRESSION: 1. No evidence of acute intracranial abnormality. 2. No evidence of acute fracture or traumatic malalignment in the cervical spine. Electronically Signed   By: Stevenson Elbe M.D.   On: 02/08/2024 10:32    Procedures Procedures  {  Document cardiac monitor, telemetry assessment procedure when appropriate:1}  Medications Ordered in ED Medications - No data to display  ED Course/ Medical Decision Making/ A&P   {   Click here for ABCD2, HEART and other calculatorsREFRESH Note before signing :1}                              Medical Decision Making  BP 129/71 (BP Location: Right Arm)   Pulse 91   Temp 99.7 F (37.6 C)   Resp (!) 27   Ht 5\' 10"  (1.778 m)   Wt 73.5 kg   SpO2 98%   BMI 23.24 kg/m   5:49 PM  89 year old male history of prostate cancer, hypertension, hypercholesterolemia, Mnire's disease brought in by family member from home after being found on the floor.  Patient report yesterday as he was walking around house, his legs gave out and he fell to the ground.  He denies hitting his head or have any loss of consciousness but he was unable to get off of the ground due to having significant pain to his right knee.  He states he was on the ground for 7 to 8 hours until his niece and nephew came over and assist him.  Furthermore, patient states he has been having a productive cough for the past 2 weeks, had a fever, neck  pain and bilateral shoulder pain.  He did have some headache earlier but that has since resolved.  Neck pain is an ongoing issue for several weeks.  He does not endorse any significant chest pain or shortness of breath denies any abdominal pain no nausea vomiting diarrhea or dysuria.  Appetite is normal.  Patient lives at home by himself, normally use a walker to walk  On exam patient is laying in bed appears to be in no acute discomfort.  He does not exhibit any signs of head trauma.  He does have some tenderness to his right cervical paraspinal muscle without significant midline spine tenderness.  He is able to range his neck without difficulty and he has no nuchal rigidity.  Heart with normal rhythm, lungs clear abdomen is soft nontender no significant tenderness along his midline spine no CVA tenderness.  Right knee is tender and warm to the touch with decreased range of motion.  X-rays ordered.  -Labs ordered, independently viewed and interpreted by me.  Labs remarkable for *** -The patient was maintained on a cardiac monitor.  I personally viewed and interpreted the cardiac monitored which showed an underlying rhythm of: *** -Imaging independently viewed and interpreted by me and I agree with radiologist's interpretation.  Result remarkable for *** -This patient presents to the ED for concern of ***, this involves an extensive number of treatment options, and is a complaint that carries with it a high risk of complications and morbidity.  The differential diagnosis includes *** -Co morbidities that complicate the patient evaluation includes *** -Treatment includes *** -Reevaluation of the patient after these medicines showed that the patient {resolved/improved/worsened:23923::"improved"} -PCP office notes or outside notes reviewed -Discussion with specialist *** -Escalation to admission/observation considered: patients feels much better, is comfortable with discharge, and will follow up with  PCP -Prescription medication considered, patient comfortable with *** -Social Determinant of Health considered which includes ***   {Document critical care time when appropriate:1} {Document review of labs and clinical decision tools ie heart score, Chads2Vasc2 etc:1}  {Document your independent review of radiology images, and  any outside records:1} {Document your discussion with family members, caretakers, and with consultants:1} {Document social determinants of health affecting pt's care:1} {Document your decision making why or why not admission, treatments were needed:1} Final Clinical Impression(s) / ED Diagnoses Final diagnoses:  None    Rx / DC Orders ED Discharge Orders     None

## 2024-02-09 NOTE — ED Notes (Signed)
Attempted PIV, unsuccessful

## 2024-02-10 ENCOUNTER — Inpatient Hospital Stay (HOSPITAL_COMMUNITY)

## 2024-02-10 ENCOUNTER — Other Ambulatory Visit: Payer: Self-pay

## 2024-02-10 ENCOUNTER — Emergency Department (HOSPITAL_COMMUNITY)

## 2024-02-10 DIAGNOSIS — R7989 Other specified abnormal findings of blood chemistry: Secondary | ICD-10-CM

## 2024-02-10 DIAGNOSIS — E8809 Other disorders of plasma-protein metabolism, not elsewhere classified: Secondary | ICD-10-CM | POA: Diagnosis present

## 2024-02-10 DIAGNOSIS — S8001XA Contusion of right knee, initial encounter: Secondary | ICD-10-CM | POA: Diagnosis present

## 2024-02-10 DIAGNOSIS — C679 Malignant neoplasm of bladder, unspecified: Secondary | ICD-10-CM

## 2024-02-10 DIAGNOSIS — Z1152 Encounter for screening for COVID-19: Secondary | ICD-10-CM | POA: Diagnosis not present

## 2024-02-10 DIAGNOSIS — N1832 Chronic kidney disease, stage 3b: Secondary | ICD-10-CM | POA: Diagnosis present

## 2024-02-10 DIAGNOSIS — F03A2 Unspecified dementia, mild, with psychotic disturbance: Secondary | ICD-10-CM | POA: Diagnosis present

## 2024-02-10 DIAGNOSIS — I10 Essential (primary) hypertension: Secondary | ICD-10-CM | POA: Diagnosis present

## 2024-02-10 DIAGNOSIS — M542 Cervicalgia: Secondary | ICD-10-CM

## 2024-02-10 DIAGNOSIS — R531 Weakness: Secondary | ICD-10-CM | POA: Diagnosis not present

## 2024-02-10 DIAGNOSIS — Z8551 Personal history of malignant neoplasm of bladder: Secondary | ICD-10-CM | POA: Diagnosis not present

## 2024-02-10 DIAGNOSIS — I129 Hypertensive chronic kidney disease with stage 1 through stage 4 chronic kidney disease, or unspecified chronic kidney disease: Secondary | ICD-10-CM | POA: Diagnosis present

## 2024-02-10 DIAGNOSIS — N400 Enlarged prostate without lower urinary tract symptoms: Secondary | ICD-10-CM | POA: Diagnosis present

## 2024-02-10 DIAGNOSIS — M6282 Rhabdomyolysis: Secondary | ICD-10-CM | POA: Diagnosis present

## 2024-02-10 DIAGNOSIS — M5136 Other intervertebral disc degeneration, lumbar region with discogenic back pain only: Secondary | ICD-10-CM | POA: Diagnosis present

## 2024-02-10 DIAGNOSIS — Z8546 Personal history of malignant neoplasm of prostate: Secondary | ICD-10-CM | POA: Diagnosis not present

## 2024-02-10 DIAGNOSIS — W19XXXA Unspecified fall, initial encounter: Secondary | ICD-10-CM | POA: Diagnosis present

## 2024-02-10 DIAGNOSIS — Z79899 Other long term (current) drug therapy: Secondary | ICD-10-CM | POA: Diagnosis not present

## 2024-02-10 DIAGNOSIS — E7800 Pure hypercholesterolemia, unspecified: Secondary | ICD-10-CM | POA: Diagnosis present

## 2024-02-10 DIAGNOSIS — S161XXA Strain of muscle, fascia and tendon at neck level, initial encounter: Secondary | ICD-10-CM | POA: Diagnosis present

## 2024-02-10 DIAGNOSIS — N179 Acute kidney failure, unspecified: Secondary | ICD-10-CM

## 2024-02-10 DIAGNOSIS — K219 Gastro-esophageal reflux disease without esophagitis: Secondary | ICD-10-CM | POA: Diagnosis present

## 2024-02-10 DIAGNOSIS — M170 Bilateral primary osteoarthritis of knee: Secondary | ICD-10-CM | POA: Diagnosis present

## 2024-02-10 DIAGNOSIS — G928 Other toxic encephalopathy: Secondary | ICD-10-CM | POA: Diagnosis present

## 2024-02-10 DIAGNOSIS — M160 Bilateral primary osteoarthritis of hip: Secondary | ICD-10-CM | POA: Diagnosis present

## 2024-02-10 DIAGNOSIS — I4430 Unspecified atrioventricular block: Secondary | ICD-10-CM | POA: Diagnosis present

## 2024-02-10 DIAGNOSIS — H8109 Meniere's disease, unspecified ear: Secondary | ICD-10-CM | POA: Diagnosis present

## 2024-02-10 DIAGNOSIS — M109 Gout, unspecified: Secondary | ICD-10-CM | POA: Diagnosis present

## 2024-02-10 DIAGNOSIS — Y92009 Unspecified place in unspecified non-institutional (private) residence as the place of occurrence of the external cause: Secondary | ICD-10-CM | POA: Diagnosis not present

## 2024-02-10 DIAGNOSIS — I480 Paroxysmal atrial fibrillation: Secondary | ICD-10-CM | POA: Diagnosis present

## 2024-02-10 DIAGNOSIS — R6510 Systemic inflammatory response syndrome (SIRS) of non-infectious origin without acute organ dysfunction: Secondary | ICD-10-CM

## 2024-02-10 LAB — RESPIRATORY PANEL BY PCR

## 2024-02-10 LAB — RESP PANEL BY RT-PCR (RSV, FLU A&B, COVID)  RVPGX2
Influenza A by PCR: NEGATIVE
Influenza B by PCR: NEGATIVE
Resp Syncytial Virus by PCR: NEGATIVE
SARS Coronavirus 2 by RT PCR: NEGATIVE

## 2024-02-10 LAB — CBC WITH DIFFERENTIAL/PLATELET
Abs Immature Granulocytes: 0.05 10*3/uL (ref 0.00–0.07)
Basophils Absolute: 0 10*3/uL (ref 0.0–0.1)
Basophils Relative: 0 %
Eosinophils Absolute: 0 10*3/uL (ref 0.0–0.5)
Eosinophils Relative: 0 %
HCT: 39.1 % (ref 39.0–52.0)
Hemoglobin: 12.5 g/dL — ABNORMAL LOW (ref 13.0–17.0)
Immature Granulocytes: 0 %
Lymphocytes Relative: 9 %
Lymphs Abs: 1.4 10*3/uL (ref 0.7–4.0)
MCH: 29.4 pg (ref 26.0–34.0)
MCHC: 32 g/dL (ref 30.0–36.0)
MCV: 92 fL (ref 80.0–100.0)
Monocytes Absolute: 2.2 10*3/uL — ABNORMAL HIGH (ref 0.1–1.0)
Monocytes Relative: 14 %
Neutro Abs: 11.9 10*3/uL — ABNORMAL HIGH (ref 1.7–7.7)
Neutrophils Relative %: 77 %
Platelets: 216 10*3/uL (ref 150–400)
RBC: 4.25 MIL/uL (ref 4.22–5.81)
RDW: 12.7 % (ref 11.5–15.5)
WBC: 15.5 10*3/uL — ABNORMAL HIGH (ref 4.0–10.5)
nRBC: 0 % (ref 0.0–0.2)

## 2024-02-10 LAB — COMPREHENSIVE METABOLIC PANEL WITH GFR
ALT: 22 U/L (ref 0–44)
AST: 66 U/L — ABNORMAL HIGH (ref 15–41)
Albumin: 3.2 g/dL — ABNORMAL LOW (ref 3.5–5.0)
Alkaline Phosphatase: 52 U/L (ref 38–126)
Anion gap: 13 (ref 5–15)
BUN: 25 mg/dL — ABNORMAL HIGH (ref 8–23)
CO2: 21 mmol/L — ABNORMAL LOW (ref 22–32)
Calcium: 9.2 mg/dL (ref 8.9–10.3)
Chloride: 109 mmol/L (ref 98–111)
Creatinine, Ser: 1.42 mg/dL — ABNORMAL HIGH (ref 0.61–1.24)
GFR, Estimated: 48 mL/min — ABNORMAL LOW (ref >60)
Glucose, Bld: 80 mg/dL (ref 70–99)
Potassium: 3.9 mmol/L (ref 3.5–5.1)
Sodium: 143 mmol/L (ref 135–145)
Total Bilirubin: 1 mg/dL (ref 0.0–1.2)
Total Protein: 7.4 g/dL (ref 6.5–8.1)

## 2024-02-10 LAB — PHOSPHORUS: Phosphorus: 2.8 mg/dL (ref 2.5–4.6)

## 2024-02-10 LAB — MAGNESIUM: Magnesium: 1.9 mg/dL (ref 1.7–2.4)

## 2024-02-10 LAB — VITAMIN B12: Vitamin B-12: 172 pg/mL — ABNORMAL LOW (ref 180–914)

## 2024-02-10 LAB — CK: Total CK: 985 U/L — ABNORMAL HIGH (ref 49–397)

## 2024-02-10 LAB — TSH: TSH: 1.577 u[IU]/mL (ref 0.350–4.500)

## 2024-02-10 LAB — PROCALCITONIN: Procalcitonin: 2.5 ng/mL

## 2024-02-10 LAB — AMMONIA: Ammonia: 21 umol/L (ref 9–35)

## 2024-02-10 LAB — I-STAT CG4 LACTIC ACID, ED: Lactic Acid, Venous: 1.2 mmol/L (ref 0.5–1.9)

## 2024-02-10 LAB — TROPONIN I (HIGH SENSITIVITY): Troponin I (High Sensitivity): 81 ng/L — ABNORMAL HIGH (ref <18)

## 2024-02-10 MED ORDER — ENOXAPARIN SODIUM 40 MG/0.4ML IJ SOSY
40.0000 mg | PREFILLED_SYRINGE | INTRAMUSCULAR | Status: DC
  Administered 2024-02-11 – 2024-02-16 (×6): 40 mg via SUBCUTANEOUS
  Filled 2024-02-10 (×6): qty 0.4

## 2024-02-10 MED ORDER — METHOCARBAMOL 1000 MG/10ML IJ SOLN
500.0000 mg | Freq: Four times a day (QID) | INTRAMUSCULAR | Status: DC | PRN
  Administered 2024-02-10 – 2024-02-11 (×2): 500 mg via INTRAVENOUS
  Filled 2024-02-10 (×2): qty 10

## 2024-02-10 MED ORDER — LACTATED RINGERS IV SOLN: INTRAVENOUS | Status: AC

## 2024-02-10 MED ORDER — HYDROCODONE-ACETAMINOPHEN 5-325 MG PO TABS
1.0000 | ORAL_TABLET | ORAL | Status: DC | PRN
  Administered 2024-02-10 – 2024-02-13 (×8): 1 via ORAL
  Filled 2024-02-10 (×8): qty 1

## 2024-02-10 MED ORDER — METOPROLOL TARTRATE 5 MG/5ML IV SOLN: 5.0000 mg | Freq: Four times a day (QID) | INTRAVENOUS | Status: DC | PRN

## 2024-02-10 MED ORDER — ONDANSETRON HCL 4 MG PO TABS
4.0000 mg | ORAL_TABLET | Freq: Four times a day (QID) | ORAL | Status: DC | PRN
  Administered 2024-02-11: 4 mg via ORAL
  Filled 2024-02-10: qty 1

## 2024-02-10 MED ORDER — ENOXAPARIN SODIUM 30 MG/0.3ML IJ SOSY
30.0000 mg | PREFILLED_SYRINGE | INTRAMUSCULAR | Status: DC
  Administered 2024-02-10: 30 mg via SUBCUTANEOUS
  Filled 2024-02-10: qty 0.3

## 2024-02-10 MED ORDER — ONDANSETRON HCL 4 MG/2ML IJ SOLN: 4.0000 mg | Freq: Four times a day (QID) | INTRAMUSCULAR | Status: DC | PRN

## 2024-02-10 MED ORDER — CYANOCOBALAMIN 1000 MCG/ML IJ SOLN
1000.0000 ug | Freq: Every day | INTRAMUSCULAR | Status: AC
  Administered 2024-02-10 – 2024-02-14 (×5): 1000 ug via INTRAMUSCULAR
  Filled 2024-02-10 (×5): qty 1

## 2024-02-10 MED ORDER — ACETAMINOPHEN 325 MG PO TABS
650.0000 mg | ORAL_TABLET | Freq: Four times a day (QID) | ORAL | Status: DC | PRN
  Administered 2024-02-12 – 2024-02-13 (×3): 650 mg via ORAL
  Filled 2024-02-10 (×3): qty 2

## 2024-02-10 MED ORDER — ACETAMINOPHEN 650 MG RE SUPP: 650.0000 mg | Freq: Four times a day (QID) | RECTAL | Status: DC | PRN

## 2024-02-10 MED ORDER — IOHEXOL 350 MG/ML SOLN
75.0000 mL | Freq: Once | INTRAVENOUS | Status: AC | PRN
  Administered 2024-02-10: 75 mL via INTRAVENOUS

## 2024-02-10 NOTE — Assessment & Plan Note (Addendum)
 Having intermittent hallucinations and paranoia for months per family  He is alert and oriented when I saw him  CXR clear and UA looks clean, no other signs of infection Check urine culture, PCT, trend CBC  No headaches/vision changes/neck rigidity  ? Dementia vs. Underlying mood disorder  Ordered Brain MRI, but has PPM  Delirium precautions/fall precautions

## 2024-02-10 NOTE — Assessment & Plan Note (Signed)
 Continue PPI ?

## 2024-02-10 NOTE — Assessment & Plan Note (Signed)
 Sudden onset per patient after eating at [employer] CT cervical spine and soft tissue neck with no acute findings  CT cervical spine:  Multilevel degenerative change including degenerative disease and facet/uncovertebral hypertrophy with varying degrees of neural foraminal stenosis He has no headache/vision changes, decreased ROM or fevers No radicular red flags in upper extremities  Continue conservative therapy with heating pad/muscle relaxer

## 2024-02-10 NOTE — Assessment & Plan Note (Signed)
 Baseline creatinine of 1.5, presenting with creatinine of 1.83 Likely secondary to rhabdomyolysis  Continue IVF Avoid nephrotoxic drugs Strict I/O Trend

## 2024-02-10 NOTE — ED Notes (Signed)
 Patient transported to CT

## 2024-02-10 NOTE — Assessment & Plan Note (Addendum)
 Check TSH/B12 B12 low, start IM replacement  Family reports weakness present for months  Check lumbar xray and hip xray- consider MRI  IVF PT/OT to eval SW for placement [facility] vs. SNF

## 2024-02-10 NOTE — Assessment & Plan Note (Signed)
 LDBT with Iodine-125 for localized adenocarcinoma of the prostate in 2002

## 2024-02-10 NOTE — Assessment & Plan Note (Signed)
 He was found to have a bladder tumor and underwent TURBT on 01/24/17. Final pathology revealed G3Ta. He then underwent induction BCG which he completed on 03/20/17. Second TURBT on 07/18/17 persistent G3Ta TCC and he underwent a second induction course of BCG which he completed on 09/20/17. Bladder biopsies were negative for cancer on 11/21/17. Office cystoscopy was OK on 11/24/22

## 2024-02-10 NOTE — Assessment & Plan Note (Signed)
 Hold statin in setting of rhabdomyolysis

## 2024-02-10 NOTE — Assessment & Plan Note (Signed)
-  Presented with tachycardia and leukocytosis. Tachycardia resolved with IVF and WBC improved to 15.5  -CXR clear, UA not impressive -He has neck pain but no fever/chills, vision changes/headaches or neck rigidity  -BC and urine cultures collected  -Received rocephin and azithromycin in ED but no complaints of cough/shortness of breath and CXR clear  -Check RVP/flu/covid/RSV negative -trend PCT  -brain MRI ordered. History of hallucinations/paranoia for months that is intermittent. If fever/headache could consider LP

## 2024-02-10 NOTE — Assessment & Plan Note (Deleted)
 Continue flomax

## 2024-02-10 NOTE — Assessment & Plan Note (Signed)
 Blood pressure decently controlled  Continue home norvasc at 5mg  daily Hold lasix, vasotec with acute on chronic kidney injury  PRN labetalol

## 2024-02-10 NOTE — Plan of Care (Signed)

## 2024-02-10 NOTE — ED Notes (Signed)
 6N given courtesy call.

## 2024-02-10 NOTE — H&P (Signed)
 History and Physical    Patient: Richard Osborne ZOX:[id number] DOB: Aug 03, 1934 DOA: 02/09/2024 DOS: the patient was seen and examined on 02/10/2024 PCP: [name], MD  Patient coming from: Home - lives alone. Uses walker/cane (sister states does not use often)    Chief Complaint: fall and weakness   HPI: Richard Osborne is a 89 y.o. male with medical history significant of HTN, HLD, hx of prostate cancer, PAF, PPM, CKD stage 3a who presented to ED after being found down at home for 6-7 hours. He has history of atrial fib, but on no blood thinners. Denies hitting his head.   He tells me over a week ago "everything started." He started to have a cough, stiffness in his neck and was weak. Yesterday he got up to use the bathroom and fell. He thinks this was around 7:30-800. He states he felt weak and kind of set himself down. He states he didn't really fall or hit anything, specifically denies hitting his head. He tried to get up all day, but was too weak. He states both his arms and legs were weak. He states family came to his house in the afternoon. They called EMS. He states his hips are sore. He bumped his left hip. He has lower back pain, denies decreased sensation or urinary incontinence. Denies saddle paraesthesias.   Denies any fever/chills, vision changes/headaches, chest pain or palpitations, shortness of breath or cough, abdominal pain, N/V/D, dysuria or leg swelling.    His sister states he has been hallucinating as well. She states he has been seeing things and complaining of someone watching him. She states this has been going on for months. She states he has had a hard time walking for a while with hip pain and joint pain. She doesn't think he has been eating well or taking his medication as he should.  He does not smoke or drink alcohol.   ER Course:  vitals: afebrile, bp: 146/77, HR: 124, RR: 18, oxygen: 97%RA Pertinent labs: troponin 99>81,   CK 1652, creatinine: 1.83 (1.5),  WBC: 17, lactic acid 2.1>1.2 CXR: shallow inspiration, no acute finding  Right knee xray: degenerative changes with small joint effusion, no acute finding  CT soft tissue neck: no acute finding  In ED: given rocephin and azithromycin, tylenol , 1L IVF and maintenance fluids. [facility] asked to admit.   Review of Systems: As mentioned in the history of present illness. All other systems reviewed and are negative. Past Medical History:  Diagnosis Date   High cholesterol    Hypertension    Meniere's disease    Prostate cancer [facility]    Past Surgical History:  Procedure Laterality Date   otic nerve surgery     PACEMAKER IMPLANT     Social History:  reports that he has never smoked. He has never used smokeless tobacco. He reports that he does not drink alcohol and does not use drugs.  Allergies  Allergen Reactions   Atorvastatin Other (See Comments)    Muscle pain    Fluvastatin Other (See Comments)    Muscle pain    Meloxicam Hypertension   Salsalate Other (See Comments)    Patient cannot recall    Simvastatin Other (See Comments)    Muscle pain     History reviewed. No pertinent family history.  Prior to Admission medications   Medication Sig Start Date End Date Taking? Authorizing Provider  amLODipine (NORVASC) 5 MG tablet Take 5 mg by mouth daily. 01/02/24  Yes  [provider]  cetirizine  (ZYRTEC ) 5 MG tablet Take 1 tablet (5 mg total) by mouth daily. 02/03/24 03/04/24 Yes Teddi Favors, DO  Cholecalciferol (D 1000) 25 MCG (1000 UT) capsule Take 5,000 Units by mouth daily as needed (Vit D deficiency).   Yes [provider]  diclofenac  Sodium (VOLTAREN ) 1 % GEL Apply 2 g topically 4 (four) times daily as needed. Patient taking differently: Apply 2 g topically 4 (four) times daily as needed (Arthritis bilateral knees). 02/08/24  Yes Long, Shereen Dike, MD  docusate sodium  (COLACE) 100 MG capsule Take 1 capsule (100 mg total) by mouth every 12 (twelve) hours. 09/20/23  Yes  Mesner, Reymundo Caulk, MD  enalapril (VASOTEC) 20 MG tablet Take 1 tablet by mouth 2 (two) times daily. 09/02/13  Yes [provider]  furosemide (LASIX) 20 MG tablet Take 20 mg by mouth 2 (two) times daily. 01/02/24  Yes [provider]  methocarbamol  (ROBAXIN ) 500 MG tablet Take 500 mg by mouth 2 (two) times daily as needed for muscle spasms.   Yes [provider]  pantoprazole (PROTONIX) 40 MG tablet Take 40 mg by mouth daily. 01/24/24  Yes [provider]  pravastatin (PRAVACHOL) 80 MG tablet Take 80 mg by mouth at bedtime. 11/14/23  Yes [provider]  lidocaine  (LIDODERM ) 5 % Place 1 patch onto the skin daily. Remove & Discard patch within 12 hours or as directed by MD Patient not taking: Reported on 02/10/2024 02/05/24   Mordecai Applebaum, MD  tiZANidine  (ZANAFLEX ) 2 MG tablet Take 1 tablet (2 mg total) by mouth every 8 (eight) hours as needed for muscle spasms. Patient not taking: Reported on 02/10/2024 02/08/24   Roberts Ching, MD    Physical Exam: Vitals:   02/10/24 0644 02/10/24 1035 02/10/24 1425 02/10/24 1738  BP: (!) 151/70 (!) 153/84 (!) 167/81 (!) 147/72  Pulse: 69 75 96 97  Resp: 17 18 18 16   Temp: 97.8 F (36.6 C) 98.3 F (36.8 C)  99.6 F (37.6 C)  TempSrc: Oral Oral    SpO2: 99% 98% 97% 97%  Weight:      Height:       General:  Appears calm and comfortable and is in NAD Eyes:  PERRL, EOMI, normal lids, iris ENT:  HOH, lips & tongue, mmm; appropriate dentition Neck:  no LAD, masses or thyromegaly; no carotid bruits Cardiovascular:  RRR, systolic murmur. No LE edema.  Respiratory:   CTA bilaterally with no wheezes/rales/rhonchi.  Normal respiratory effort. Abdomen:  soft, NT, ND, NABS Back:   normal alignment, no CVAT Skin:  no rash or induration seen on limited exam Musculoskeletal:  grossly normal tone BUE/BLE, decreased strength bilateral LE, R>L. good ROM, no bony abnormality Lower extremity:  No LE edema.  Limited foot exam  with no ulcerations.  2+ distal pulses. Psychiatric:  grossly normal mood and affect, speech fluent and appropriate, AOx3 Neurologic:  CN 2-12 grossly intact, moves all extremities in coordinated fashion, sensation intact   Radiological Exams on Admission: Independently reviewed - see discussion in A/P where applicable  DG Lumbar Spine 2-3 Views Result Date: 02/10/2024 CLINICAL DATA:  Fall, low back pain EXAM: LUMBAR SPINE - 2-3 VIEW COMPARISON:  None Available. FINDINGS: Normal lumbar lordosis. No acute fracture or listhesis. Mild sigmoid curvature of the lumbar spine. Vertebral body height is preserved. Intervertebral disc space narrowing and endplate remodeling is seen at L1-L5 in keeping with changes of moderate to severe degenerative disc disease. Paraspinal soft  tissues are unremarkable. IMPRESSION: 1. Moderate to severe diffuse degenerative disc disease. No acute fracture or listhesis. Electronically Signed   By: Worthy Heads M.D.   On: 02/10/2024 12:27   DG HIPS BILAT WITH PELVIS 2V Result Date: 02/10/2024 CLINICAL DATA:  Fall, bilateral hip and low back pain EXAM: DG HIP (WITH OR WITHOUT PELVIS) 2V BILAT COMPARISON:  None Available. FINDINGS: Normal alignment. No acute fracture or dislocation. Severe left hip degenerative arthritis with complete loss the joint space, remodeling of the femoral head, and subchondral cyst formation. Mild-to-moderate right hip degenerative arthritis. Sacroiliac joint spaces are preserved. Brachytherapy seeds are seen within the prostate gland. IMPRESSION: 1. Severe left and mild-to-moderate right hip degenerative arthritis. Electronically Signed   By: Worthy Heads M.D.   On: 02/10/2024 12:26   CT Soft Tissue Neck W Contrast Result Date: 02/10/2024 CLINICAL DATA:  Soft tissue infection suspected (Ped 0-17y) EXAM: CT NECK WITH CONTRAST TECHNIQUE: Multidetector CT imaging of the neck was performed using the standard protocol following the bolus administration of  intravenous contrast. RADIATION DOSE REDUCTION: This exam was performed according to the departmental dose-optimization program which includes automated exposure control, adjustment of the mA and/or kV according to patient size and/or use of iterative reconstruction technique. CONTRAST:  75mL OMNIPAQUE  IOHEXOL  350 MG/ML SOLN COMPARISON:  None Available. FINDINGS: Pharynx and larynx: Normal. No mass or swelling. Salivary glands: No inflammation, mass, or stone. Thyroid: Normal. Lymph nodes: None enlarged or abnormal density. Vascular: Major arteries are patent in the neck on limited assessment. Limited intracranial: Negative. Visualized orbits: Negative. Mastoids and visualized paranasal sinuses: Clear. Left mastoidectomy. Skeleton: Multilevel degenerative change including facet and uncovertebral hypertrophy that contributes to varying degrees of neural foraminal stenosis. No acute abnormality on limited assessment. Upper chest: Visualized lung apices are clear. IMPRESSION: No evidence of acute abnormality. Electronically Signed   By: Stevenson Elbe M.D.   On: 02/10/2024 02:50   DG Knee Complete 4 Views Right Result Date: 02/09/2024 CLINICAL DATA:  Recent fall with right knee pain, initial encounter EXAM: RIGHT KNEE - COMPLETE 4+ VIEW COMPARISON:  09/27/2004 FINDINGS: Small joint effusion is noted. Tricompartmental degenerative changes are seen. No acute fracture is identified. IMPRESSION: Degenerative changes with small joint effusion. No acute abnormality noted. Electronically Signed   By: Violeta Grey M.D.   On: 02/09/2024 23:28   DG Chest 2 View Result Date: 02/09/2024 CLINICAL DATA:  Cough.  Patient fell.  Coughing up mucus. EXAM: CHEST - 2 VIEW COMPARISON:  02/08/2024 FINDINGS: Cardiac pacemaker. Shallow inspiration. Heart size and pulmonary vascularity are normal. Lungs are clear. No pleural effusion or pneumothorax. Mediastinal contours appear intact. Degenerative changes in the spine and shoulders.  IMPRESSION: Shallow inspiration.  No evidence of active pulmonary disease. Electronically Signed   By: Boyce Byes M.D.   On: 02/09/2024 21:29    EKG: Independently reviewed.  Ventricular paced with rate 119; nonspecific ST changes with no evidence of acute ischemia   Labs on Admission: I have personally reviewed the available labs and imaging studies at the time of the admission.  Pertinent labs:   troponin 99>81,    CK 1652,  creatinine: 1.83 (1.5), WBC: 17, lactic acid 2.1>1.2  Assessment and Plan: Principal Problem:   Rhabdomyolysis Active Problems:   Acute renal failure superimposed on stage 3a chronic kidney disease (HCC)   Weakness   SIRS (systemic inflammatory response syndrome) (HCC)   Neck pain   Memory loss   Elevated troponin   Benign hypertension  Bladder cancer (HCC)   Dyslipidemia   History of prostate cancer   Gastroesophageal reflux disease   Pacemaker    Assessment and Plan: * Rhabdomyolysis 89 year old who had a fall at home due to weakness and was on ground x 6-7 hours found to be in rhabdomyolysis with elevated CK and acute on chronic kidney injury -place in obs on tele -continue IVF and trend CK -CK improved to 985 today  -mag/phos wnl -UA with moderate hgb/ 0-5 RBC  -PT/OT eval    Acute renal failure superimposed on stage 3a chronic kidney disease (HCC) Baseline creatinine of 1.5, presenting with creatinine of 1.83 Likely secondary to rhabdomyolysis  Continue IVF Avoid nephrotoxic drugs Strict I/O Trend   SIRS (systemic inflammatory response syndrome) (HCC) -Presented with tachycardia and leukocytosis. Tachycardia resolved with IVF and WBC improved to 15.5  -CXR clear, UA not impressive -He has neck pain but no fever/chills, vision changes/headaches or neck rigidity  -BC and urine cultures collected  -Received rocephin and azithromycin in ED but no complaints of cough/shortness of breath and CXR clear  -Check RVP/flu/covid/RSV  negative -trend PCT  -brain MRI ordered. History of hallucinations/paranoia for months that is intermittent. If fever/headache could consider LP   Weakness Check TSH/B12 B12 low, start IM replacement  Family reports weakness present for months  Check lumbar xray and hip xray- consider MRI  IVF PT/OT to eval SW for placement AL vs. SNF    Neck pain Sudden onset per patient after eating at [employer] CT cervical spine and soft tissue neck with no acute findings  CT cervical spine:  Multilevel degenerative change including degenerative disease and facet/uncovertebral hypertrophy with varying degrees of neural foraminal stenosis He has no headache/vision changes, decreased ROM or fevers No radicular red flags in upper extremities  Continue conservative therapy with heating pad/muscle relaxer    Elevated troponin Troponin flat, no new EKG changes and no complaints of chest pain  Likely secondary to rhabdomyolysis  On tele, continue to monitor   Memory loss Having intermittent hallucinations and paranoia for months per family  He is alert and oriented when I saw him  CXR clear and UA looks clean, no other signs of infection Check urine culture, PCT, trend CBC  No headaches/vision changes/neck rigidity  ? Dementia vs. Underlying mood disorder  Ordered Brain MRI, but has PPM  Delirium precautions/fall precautions   Benign hypertension Blood pressure decently controlled  Continue home norvasc at 5mg  daily Hold lasix, vasotec with acute on chronic kidney injury  PRN labetalol   Bladder cancer (HCC) He was found to have a bladder tumor and underwent TURBT on 01/24/17. Final pathology revealed G3Ta. He then underwent induction BCG which he completed on 03/20/17. Second TURBT on 07/18/17 persistent G3Ta TCC and he underwent a second induction course of BCG which he completed on 09/20/17. Bladder biopsies were negative for cancer on 11/21/17. Office cystoscopy was OK on 11/24/22    Dyslipidemia Hold statin in setting of rhabdomyolysis   History of prostate cancer LDBT with Iodine-125 for localized adenocarcinoma of the prostate in 2002   Gastroesophageal reflux disease Continue PPI     Advance Care Planning:   Code Status: Full Code    Consults: SW/PT/OT   DVT Prophylaxis: lovenox   Family Communication: updated his sister by phone  Severity of Illness: The appropriate patient status for this patient is OBSERVATION. Observation status is judged to be reasonable and necessary in order to provide the required intensity of service  to ensure the patient's safety. The patient's presenting symptoms, physical exam findings, and initial radiographic and laboratory data in the context of their medical condition is felt to place them at decreased risk for further clinical deterioration. Furthermore, it is anticipated that the patient will be medically stable for discharge from the hospital within 2 midnights of admission.   Author: Raymona Caldwell, MD 02/10/2024 6:09 PM  For on call review [email or web address].

## 2024-02-10 NOTE — Assessment & Plan Note (Addendum)
 89 year old who had a fall at home due to weakness and was on ground x 6-7 hours found to be in rhabdomyolysis with elevated CK and acute on chronic kidney injury -place in obs on tele -continue IVF and trend CK -CK improved to 985 today  -mag/phos wnl -UA with moderate hgb/ 0-5 RBC  -PT/OT eval

## 2024-02-10 NOTE — Care Management Obs Status (Signed)
 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 Patient signed initials.   Jannine Meo, RN 02/10/2024, 9:13 AM

## 2024-02-10 NOTE — Assessment & Plan Note (Addendum)
 Troponin flat, no new EKG changes and no complaints of chest pain  Likely secondary to rhabdomyolysis  On tele, continue to monitor

## 2024-02-11 ENCOUNTER — Inpatient Hospital Stay (HOSPITAL_COMMUNITY)

## 2024-02-11 DIAGNOSIS — M6282 Rhabdomyolysis: Secondary | ICD-10-CM

## 2024-02-11 DIAGNOSIS — N179 Acute kidney failure, unspecified: Secondary | ICD-10-CM | POA: Diagnosis not present

## 2024-02-11 DIAGNOSIS — Z950 Presence of cardiac pacemaker: Secondary | ICD-10-CM

## 2024-02-11 DIAGNOSIS — M542 Cervicalgia: Secondary | ICD-10-CM

## 2024-02-11 DIAGNOSIS — I10 Essential (primary) hypertension: Secondary | ICD-10-CM

## 2024-02-11 DIAGNOSIS — R6510 Systemic inflammatory response syndrome (SIRS) of non-infectious origin without acute organ dysfunction: Secondary | ICD-10-CM

## 2024-02-11 DIAGNOSIS — R7989 Other specified abnormal findings of blood chemistry: Secondary | ICD-10-CM

## 2024-02-11 DIAGNOSIS — K219 Gastro-esophageal reflux disease without esophagitis: Secondary | ICD-10-CM

## 2024-02-11 DIAGNOSIS — E785 Hyperlipidemia, unspecified: Secondary | ICD-10-CM

## 2024-02-11 DIAGNOSIS — R531 Weakness: Secondary | ICD-10-CM

## 2024-02-11 DIAGNOSIS — N1831 Chronic kidney disease, stage 3a: Secondary | ICD-10-CM

## 2024-02-11 DIAGNOSIS — C679 Malignant neoplasm of bladder, unspecified: Secondary | ICD-10-CM

## 2024-02-11 LAB — COMPREHENSIVE METABOLIC PANEL WITH GFR
ALT: 23 U/L (ref 0–44)
AST: 46 U/L — ABNORMAL HIGH (ref 15–41)
Albumin: 2.7 g/dL — ABNORMAL LOW (ref 3.5–5.0)
Alkaline Phosphatase: 43 U/L (ref 38–126)
Anion gap: 9 (ref 5–15)
BUN: 23 mg/dL (ref 8–23)
CO2: 22 mmol/L (ref 22–32)
Calcium: 8.8 mg/dL — ABNORMAL LOW (ref 8.9–10.3)
Chloride: 110 mmol/L (ref 98–111)
Creatinine, Ser: 1.39 mg/dL — ABNORMAL HIGH (ref 0.61–1.24)
GFR, Estimated: 49 mL/min — ABNORMAL LOW (ref >60)
Glucose, Bld: 118 mg/dL — ABNORMAL HIGH (ref 70–99)
Potassium: 4.4 mmol/L (ref 3.5–5.1)
Sodium: 141 mmol/L (ref 135–145)
Total Bilirubin: 0.9 mg/dL (ref 0.0–1.2)
Total Protein: 6.5 g/dL (ref 6.5–8.1)

## 2024-02-11 LAB — CBC
HCT: 35.3 % — ABNORMAL LOW (ref 39.0–52.0)
Hemoglobin: 11.2 g/dL — ABNORMAL LOW (ref 13.0–17.0)
MCH: 29.2 pg (ref 26.0–34.0)
MCHC: 31.7 g/dL (ref 30.0–36.0)
MCV: 92.2 fL (ref 80.0–100.0)
Platelets: 237 10*3/uL (ref 150–400)
RBC: 3.83 MIL/uL — ABNORMAL LOW (ref 4.22–5.81)
RDW: 12.7 % (ref 11.5–15.5)
WBC: 15.9 10*3/uL — ABNORMAL HIGH (ref 4.0–10.5)
nRBC: 0 % (ref 0.0–0.2)

## 2024-02-11 LAB — CK: Total CK: 351 U/L (ref 49–397)

## 2024-02-11 LAB — PROCALCITONIN: Procalcitonin: 1.77 ng/mL

## 2024-02-11 MED ORDER — IOHEXOL 350 MG/ML SOLN
75.0000 mL | Freq: Once | INTRAVENOUS | Status: AC | PRN
  Administered 2024-02-11: 75 mL via INTRAVENOUS

## 2024-02-11 MED ORDER — LACTATED RINGERS IV SOLN: INTRAVENOUS | Status: AC

## 2024-02-11 NOTE — Evaluation (Addendum)
 Occupational Therapy Evaluation Patient Details Name: Richard Osborne MRN: [id number] DOB: [birth year] Today's Date: 02/11/2024   History of Present Illness   Pt is an 89 y/o M presenting to ED on 5/2 after being found on floor, down x6-7 hours, found to have rhabdomyolysis with elevated CK and acute on chronic kidney injury. PMH includes prostate CA, HTN, hypercholesteremia, meniere's disease.     Clinical Impressions Pt reports ind at baseline with ADLs and has been using [facility] recently for mobility, pt lives alone but has family to check on him PRN. Pt currently limited by significant R knee, pain unable to perform AROM or weight bear, MD notified. Pt needing up to max A for ADLs, mod A for bed mobility, and max +2 for standing attempts, pt minimally able to clear hips and unable to stand upright. Pt presenting with impairments listed below, will follow acutely. Patient will benefit from continued inpatient follow up therapy, <3 hours/day to maximize safety/ind with ADL/functional mobility.      If plan is discharge home, recommend the following:   Two people to help with walking and/or transfers;A lot of help with bathing/dressing/bathroom;Assistance with cooking/housework;Direct supervision/assist for medications management;Direct supervision/assist for financial management;Assist for transportation;Help with stairs or ramp for entrance     Functional Status Assessment   Patient has had a recent decline in their functional status and demonstrates the ability to make significant improvements in function in a reasonable and predictable amount of time.     Equipment Recommendations   Tub/shower seat;BSC/3in1     Recommendations for Other Services   PT consult     Precautions/Restrictions   Precautions Precautions: Fall Restrictions Weight Bearing Restrictions Per Provider Order: No     Mobility Bed Mobility Overal bed mobility: Needs Assistance Bed Mobility:  Sidelying to Sit, Sit to Supine   Sidelying to sit: Mod assist   Sit to supine: Mod assist   General bed mobility comments: assist for R > L LE    Transfers Overall transfer level: Needs assistance Equipment used: Rolling walker (2 wheels), 2 person hand held assist Transfers: Sit to/from Stand Sit to Stand: Max assist, +2 physical assistance                  Balance Overall balance assessment: Needs assistance Sitting-balance support: Feet supported Sitting balance-Leahy Scale: Fair     Standing balance support: Reliant on assistive device for balance, During functional activity Standing balance-Leahy Scale: Zero Standing balance comment: unable                           ADL either performed or assessed with clinical judgement   ADL Overall ADL's : Needs assistance/impaired Eating/Feeding: Sitting;Minimal assistance;Bed level   Grooming: Minimal assistance;Bed level;Sitting   Upper Body Bathing: Moderate assistance   Lower Body Bathing: Maximal assistance   Upper Body Dressing : Minimal assistance   Lower Body Dressing: Maximal assistance   Toilet Transfer: Maximal assistance;+2 for physical assistance   Toileting- Clothing Manipulation and Hygiene: Maximal assistance       Functional mobility during ADLs: Maximal assistance;+2 for physical assistance       Vision   Vision Assessment?: No apparent visual deficits     Perception Perception: Not tested       Praxis Praxis: Not tested       Pertinent Vitals/Pain Pain Assessment Pain Assessment: Faces Pain Score: 6  Faces Pain Scale: Hurts even more Pain Location: R knee  Pain Descriptors / Indicators: Discomfort Pain Intervention(s): Limited activity within patient's tolerance, Monitored during session, Repositioned     Extremity/Trunk Assessment Upper Extremity Assessment Upper Extremity Assessment: Generalized weakness (limited, but gaurding R knee at EOB, WFL to reach for  RW)   Lower Extremity Assessment Lower Extremity Assessment: Defer to PT evaluation   Cervical / Trunk Assessment Cervical / Trunk Assessment: Normal   Communication Communication Communication: No apparent difficulties   Cognition Arousal: Alert Behavior During Therapy: WFL for tasks assessed/performed Cognition: No family/caregiver present to determine baseline             OT - Cognition Comments: decr safety awareness, awareness of deficits                 Following commands: Intact       Cueing  General Comments   Cueing Techniques: Verbal cues  VSS   Exercises     Shoulder Instructions      Home Living Family/patient expects to be discharged to:: Private residence Living Arrangements: Alone Available Help at Discharge: Family;Friend(s);Available PRN/intermittently Type of Home: House Home Access: Level entry     Home Layout: One level     Bathroom Shower/Tub: Tub/shower unit         Home Equipment: Cane - single Librarian, academic (2 wheels);Rollator (4 wheels);Shower seat;Grab bars - tub/shower          Prior Functioning/Environment Prior Level of Function : Needs assist             Mobility Comments: ind without AD, reports recent use of SPC at times ADLs Comments: ind with ADLs, sometimes makes simple meals (bkfst) -- goes out to eat lunch/dinner, drives, manages own meds    OT Problem List: Decreased strength;Decreased range of motion;Decreased activity tolerance;Impaired balance (sitting and/or standing);Decreased coordination;Decreased safety awareness;Pain   OT Treatment/Interventions: Therapeutic exercise;Self-care/ADL training;Energy conservation;DME and/or AE instruction;Therapeutic activities;Balance training;Patient/family education      OT Goals(Current goals can be found in the care plan section)   Acute Rehab OT Goals Patient Stated Goal: none stated OT Goal Formulation: With patient Time For Goal  Achievement: 02/25/24 Potential to Achieve Goals: Good ADL Goals Pt Will Perform Grooming: with modified independence;sitting Pt Will Perform Upper Body Dressing: with modified independence;standing;sitting Pt Will Perform Lower Body Dressing: with modified independence;sitting/lateral leans;sit to/from stand Pt Will Transfer to Toilet: with modified independence;ambulating;regular height toilet Pt Will Perform Tub/Shower Transfer: Tub transfer;Shower transfer;with modified independence;ambulating;shower seat   OT Frequency:  Min 2X/week    Co-evaluation              AM-PAC OT "6 Clicks" Daily Activity     Outcome Measure Help from another person eating meals?: A Little Help from another person taking care of personal grooming?: A Little Help from another person toileting, which includes using toliet, bedpan, or urinal?: A Lot Help from another person bathing (including washing, rinsing, drying)?: A Lot Help from another person to put on and taking off regular upper body clothing?: A Lot Help from another person to put on and taking off regular lower body clothing?: A Lot 6 Click Score: 14   End of Session Equipment Utilized During Treatment: Gait belt;Rolling walker (2 wheels) Nurse Communication: Mobility status;Patient requests pain meds  Activity Tolerance: Patient tolerated treatment well Patient left: in bed;with call bell/phone within reach;with bed alarm set;with nursing/sitter in room  OT Visit Diagnosis: Unsteadiness on feet (R26.81);Other abnormalities of gait and mobility (R26.89);Muscle weakness (generalized) (M62.81);History of falling (Z91.81)  Time: 6440-3474 OT Time Calculation (min): 30 min Charges:  OT General Charges $OT Visit: 1 Visit OT Evaluation $OT Eval Moderate Complexity: 1 Mod OT Treatments $Self Care/Home Management : 8-22 mins  Miia Blanks K, OTD, OTR/L SecureChat Preferred Acute Rehab [phone]   Benedict Brain  Koonce 02/11/2024, 8:56 AM

## 2024-02-11 NOTE — Progress Notes (Signed)
 PROGRESS NOTE    Richard Osborne  UEA:[id number] DOB: [birth year] DOA: 02/09/2024 PCP: [name], MD   Brief Narrative:  Richard Osborne is a 89 y.o. male with medical history significant of HTN, HLD, hx of prostate cancer, PAF, PPM, CKD stage 3a who presented to ED after being found down at home for 6-7 hours. He has history of atrial fib, but on no blood thinners. Denies hitting his head.    His sister states he has been hallucinating as well. She states he has been seeing things and complaining of someone watching him. She states this has been going on for months. She states he has had a hard time walking for a while with hip pain and joint pain. She doesn't think he has been eating well or taking his medication as he should.  **Interim History:  Right knee very swollen and painful so will get further imaging. PT/OT recommending SNF  Assessment and Plan:  Rhabdomyolysis: 89 year old who had a fall at home due to weakness and was on ground x 6-7 hours found to be in rhabdomyolysis with elevated CK and acute on chronic kidney injury -place in obs on tele. continue IVF and trend CK -CK improved to 351 today; mag/phos wnl -UA with moderate hgb/ 0-5 RBC  -PT/OT eval recommending SNF    Acute renal failure superimposed on stage 3a chronic kidney disease (HCC) Likely secondary to rhabdomyolysis  -BUN/Cr Trend: Recent Labs  Lab 02/05/24 1734 02/08/24 0813 02/09/24 2110 02/10/24 1229 02/11/24 0335  BUN 18 20 27* 25* 23  CREATININE 1.60* 1.51* 1.83* 1.42* 1.39*  -Getting IVF -Avoid Nephrotoxic Medications, Contrast Dyes, Hypotension and Dehydration to Ensure Adequate Renal Perfusion and will need to Renally Adjust Meds -Continue to Monitor and Trend Renal Function carefully and repeat CMP in the AM    SIRS (systemic inflammatory response syndrome) (HCC) -Presented with tachycardia and leukocytosis. Tachycardia resolved with IVF and WBC improved to 15.5  -CXR clear, UA not  impressive -He has neck pain but no fever/chills, vision changes/headaches or neck rigidity  -BC and urine cultures collected  -WBC remains elevated and went from 15.5 -> 15.9 -Received rocephin and azithromycin in ED but no complaints of cough/shortness of breath and CXR clear  -Check RVP/flu/covid/RSV negative -trend PCT and trending down and went from 2.50 -> 1.77 -Right Knee very swollen and painful so will check CT knee given X-Ray showed  degenerative changes with small joint effusion. No acute abnormality noted. -brain MRI ordered. History of hallucinations/paranoia for months that is intermittent. If fever/headache could consider LP    Generalized Weakness: Checked TSH and was 1.577. B12 172 so start IM replacement.  Family reports weakness present for months. Check lumbar xray and hip xray- consider MRI. IVF. PT/OT to eval and recommending SNF SW for placement AL vs. SNF     Neck pain: Sudden onset per patient after eating at [employer]. CT cervical spine and soft tissue neck with no acute findings. CT cervical spine:  Multilevel degenerative change including degenerative disease and facet/uncovertebral hypertrophy with varying degrees of neural foraminal stenosis He has no headache/vision changes, decreased ROM or fevers. No radicular red flags in upper extremities .Continue conservative therapy with heating pad/muscle relaxer    Elevated troponin: Troponin flat, no new EKG changes and no complaints of chest pain  Likely secondary to rhabdomyolysis; On tele, continue to monitor    Memory loss: Having intermittent hallucinations and paranoia for months per family; CXR clear and UA  looks clean, no other signs of infection. Check urine culture, PCT (2.50 -> 1.77), trend CBC; No headaches/vision changes/neck rigidity/ ? Dementia vs. Underlying mood disorder. Ordered Brain MRI, but has PPM. Delirium precautions/fall precautions. If hallucinations persist despite w/u may benefit from Psych  evaluation   Benign hypertension: Blood pressure decently controlled.  Continue home norvasc at 5mg  daily. Hold lasix, vasotec with acute on chronic kidney injury. PRN labetalol. CTM BP per Protocol. Last BP reading    Bladder cancer [facility]): He was found to have a bladder tumor and underwent TURBT on 01/24/17. Final pathology revealed G3Ta. He then underwent induction BCG which he completed on 03/20/17. Second TURBT on 07/18/17 persistent G3Ta TCC and he underwent a second induction course of BCG which he completed on 09/20/17. Bladder biopsies were negative for cancer on 11/21/17. Office cystoscopy was OK on 11/24/22    Dyslipidemia: Hold statin in setting of rhabdomyolysis    History of prostate cancer: LDBT with Iodine-125 for localized adenocarcinoma of the prostate in 2002; Will need outpt F/U   Gastroesophageal reflux disease/GI Prophylaxis: C/w PPI    Hypoalbuminemia: Patient's Albumin trened down and now 2.7. CTM and Trend and repeat CMP in the AM   DVT prophylaxis: enoxaparin (LOVENOX) injection 40 mg Start: 02/11/24 1000 SCDs Start: 02/10/24 0950    Code Status: Full Code Family Communication: No family present @ bedside  Disposition Plan:  Level of care: Telemetry Medical Status is: Inpatient Remains inpatient appropriate because: Needs further clinical improvement and evaluation in his knee   Consultants:  None  Procedures:  As delineated as above  Antimicrobials:  Anti-infectives (From admission, onward)    Start     Dose/Rate Route Frequency Ordered Stop   02/09/24 2315  cefTRIAXone (ROCEPHIN) 1 g in sodium chloride  0.9 % 100 mL IVPB        1 g 200 mL/hr over 30 Minutes Intravenous  Once 02/09/24 2307 02/10/24 0137   02/09/24 2315  azithromycin (ZITHROMAX) 500 mg in sodium chloride  0.9 % 250 mL IVPB        500 mg 250 mL/hr over 60 Minutes Intravenous  Once 02/09/24 2307 02/10/24 0220       Subjective: Seen and examined at bedside and believes all his symptoms  started when he started eating [employer] on Friday.  Denied any lightheadedness or dizziness.  Continues to complain of some neck pain.  No nausea or vomiting.  Objective: Vitals:   02/11/24 0513 02/11/24 0815 02/11/24 1549 02/11/24 2022  BP: (!) 156/85 (!) 176/81 (!) 172/77 127/64  Pulse: 84 87 (!) 102 86  Resp: 20 16 15 18   Temp: 98.6 F (37 C) 98.3 F (36.8 C) 99.9 F (37.7 C) 98.6 F (37 C)  TempSrc: Oral Oral Oral Oral  SpO2: 98% 99% 95% 94%  Weight:      Height:        Intake/Output Summary (Last 24 hours) at 02/11/2024 2040 Last data filed at 02/11/2024 1100 Gross per 24 hour  Intake 372.65 ml  Output 700 ml  Net -327.35 ml   Filed Weights   02/09/24 2044  Weight: 73.5 kg   Examination: Physical Exam:  Constitutional: Thin only African-American male who appears calm and in no acute distress but complains of neck pain Respiratory: Diminished to auscultation bilaterally, no wheezing, rales, rhonchi or crackles. Normal respiratory effort and patient is not tachypenic. No accessory muscle use.  Unlabored breathing Cardiovascular: RRR, no murmurs / rubs / gallops. S1 and S2 auscultated. No  extremity edema.  Abdomen: Soft, non-tender, non-distended. Bowel sounds positive.  GU: Deferred. Musculoskeletal: Right knee is extremely swollen Skin: No rashes, lesions, ulcers on limited skin evaluation. No induration; Warm and dry.  Neurologic: CN 2-12 grossly intact with no focal deficits. Romberg sign and cerebellar reflexes not assessed.  Psychiatric: He is awake and alert  Data Reviewed: I have personally reviewed following labs and imaging studies  CBC: Recent Labs  Lab 02/05/24 1734 02/08/24 0813 02/09/24 2110 02/10/24 1229 02/11/24 0335  WBC 10.2 13.0* 17.0* 15.5* 15.9*  NEUTROABS 6.7 9.8* 13.5* 11.9*  --   HGB 13.0 11.9* 12.6* 12.5* 11.2*  HCT 40.4 36.2* 39.0 39.1 35.3*  MCV 92.9 90.7 91.8 92.0 92.2  PLT 230 198 246 216 237   Basic Metabolic Panel: Recent Labs   Lab 02/05/24 1734 02/08/24 0813 02/09/24 2110 02/10/24 1229 02/11/24 0335  NA 140 139 136 143 141  K 4.5 4.5 4.2 3.9 4.4  CL 104 103 104 109 110  CO2 21* 25 20* 21* 22  GLUCOSE 87 143* 236* 80 118*  BUN 18 20 27* 25* 23  CREATININE 1.60* 1.51* 1.83* 1.42* 1.39*  CALCIUM 10.0 9.9 9.3 9.2 8.8*  MG  --   --   --  1.9  --   PHOS  --   --   --  2.8  --    GFR: Estimated Creatinine Clearance: 38.7 mL/min (A) (by C-G formula based on SCr of 1.39 mg/dL (H)). Liver Function Tests: Recent Labs  Lab 02/08/24 0813 02/09/24 2110 02/10/24 1229 02/11/24 0335  AST 22 63* 66* 46*  ALT 10 19 22 23   ALKPHOS 54 52 52 43  BILITOT 0.8 1.3* 1.0 0.9  PROT 7.8 7.7 7.4 6.5  ALBUMIN 4.2 3.4* 3.2* 2.7*   Recent Labs  Lab 02/08/24 0813  LIPASE 33   Recent Labs  Lab 02/10/24 1229  AMMONIA 21   Coagulation Profile: No results for input(s): "INR", "PROTIME" in the last 168 hours. Cardiac Enzymes: Recent Labs  Lab 02/09/24 2110 02/10/24 1229 02/11/24 0335  CKTOTAL 1,652* 985* 351   BNP (last 3 results) No results for input(s): "PROBNP" in the last 8760 hours. HbA1C: No results for input(s): "HGBA1C" in the last 72 hours. CBG: No results for input(s): "GLUCAP" in the last 168 hours. Lipid Profile: No results for input(s): "CHOL", "HDL", "LDLCALC", "TRIG", "CHOLHDL", "LDLDIRECT" in the last 72 hours. Thyroid Function Tests: Recent Labs    02/10/24 1229  TSH 1.577   Anemia Panel: Recent Labs    02/10/24 1229  VITAMINB12 172*   Sepsis Labs: Recent Labs  Lab 02/09/24 2115 02/10/24 0048 02/10/24 1229 02/11/24 0335  PROCALCITON  --   --  2.50 1.77  LATICACIDVEN 2.1* 1.2  --   --    Recent Results (from the past 240 hours)  Resp panel by RT-PCR (RSV, Flu A&B, Covid) Anterior Nasal Swab     Status: None   Collection Time: 02/03/24  8:47 AM   Specimen: Anterior Nasal Swab  Result Value Ref Range Status   SARS Coronavirus 2 by RT PCR NEGATIVE NEGATIVE Final    Comment:  (NOTE) SARS-CoV-2 target nucleic acids are NOT DETECTED.  The SARS-CoV-2 RNA is generally detectable in upper respiratory specimens during the acute phase of infection. The lowest concentration of SARS-CoV-2 viral copies this assay can detect is 138 copies/mL. A negative result does not preclude SARS-Cov-2 infection and should not be used as the sole basis for treatment or other  patient management decisions. A negative result may occur with  improper specimen collection/handling, submission of specimen other than nasopharyngeal swab, presence of viral mutation(s) within the areas targeted by this assay, and inadequate number of viral copies(<138 copies/mL). A negative result must be combined with clinical observations, patient history, and epidemiological information. The expected result is Negative.  Fact Sheet for Patients:  BloggerCourse.com  Fact Sheet for Healthcare Providers:  SeriousBroker.it  This test is no t yet approved or cleared by the [place]  FDA and  has been authorized for detection and/or diagnosis of SARS-CoV-2 by FDA under an Emergency Use Authorization (EUA). This EUA will remain  in effect (meaning this test can be used) for the duration of the COVID-19 declaration under Section 564(b)(1) of the Act, 21 U.S.C.section 360bbb-3(b)(1), unless the authorization is terminated  or revoked sooner.       Influenza A by PCR NEGATIVE NEGATIVE Final   Influenza B by PCR NEGATIVE NEGATIVE Final    Comment: (NOTE) The Xpert Xpress SARS-CoV-2/FLU/RSV plus assay is intended as an aid in the diagnosis of influenza from Nasopharyngeal swab specimens and should not be used as a sole basis for treatment. Nasal washings and aspirates are unacceptable for Xpert Xpress SARS-CoV-2/FLU/RSV testing.  Fact Sheet for Patients: BloggerCourse.com  Fact Sheet for Healthcare  Providers: SeriousBroker.it  This test is not yet approved or cleared by the [place]  FDA and has been authorized for detection and/or diagnosis of SARS-CoV-2 by FDA under an Emergency Use Authorization (EUA). This EUA will remain in effect (meaning this test can be used) for the duration of the COVID-19 declaration under Section 564(b)(1) of the Act, 21 U.S.C. section 360bbb-3(b)(1), unless the authorization is terminated or revoked.     Resp Syncytial Virus by PCR NEGATIVE NEGATIVE Final    Comment: (NOTE) Fact Sheet for Patients: BloggerCourse.com  Fact Sheet for Healthcare Providers: SeriousBroker.it  This test is not yet approved or cleared by the [place]  FDA and has been authorized for detection and/or diagnosis of SARS-CoV-2 by FDA under an Emergency Use Authorization (EUA). This EUA will remain in effect (meaning this test can be used) for the duration of the COVID-19 declaration under Section 564(b)(1) of the Act, 21 U.S.C. section 360bbb-3(b)(1), unless the authorization is terminated or revoked.  Performed at [facility], [address] [place], [place] [address]   Blood culture (routine x 2)     Status: None (Preliminary result)   Collection Time: 02/09/24  9:56 PM   Specimen: BLOOD LEFT ARM  Result Value Ref Range Status   Specimen Description BLOOD LEFT ARM  Final   Special Requests   Final    BOTTLES DRAWN AEROBIC AND ANAEROBIC Blood Culture results may not be optimal due to an inadequate volume of blood received in culture bottles   Culture   Final    NO GROWTH 2 DAYS Performed at [facility], [address] [place], [place] [address]    Report Status PENDING  Incomplete  Blood culture (routine x 2)     Status: None (Preliminary result)   Collection Time: 02/09/24  9:56 PM   Specimen: BLOOD RIGHT ARM  Result Value Ref Range Status   Specimen  Description BLOOD RIGHT ARM  Final   Special Requests   Final    BOTTLES DRAWN AEROBIC ONLY Blood Culture adequate volume   Culture   Final    NO GROWTH 2 DAYS Performed at [facility], [address] [place], [place] [address]    Report Status PENDING  Incomplete  Resp panel by RT-PCR (RSV, Flu A&B, Covid) Anterior Nasal Swab     Status: None   Collection Time: 02/09/24 11:07 PM   Specimen: Anterior Nasal Swab  Result Value Ref Range Status   SARS Coronavirus 2 by RT PCR NEGATIVE NEGATIVE Final   Influenza A by PCR NEGATIVE NEGATIVE Final   Influenza B by PCR NEGATIVE NEGATIVE Final    Comment: (NOTE) The Xpert Xpress SARS-CoV-2/FLU/RSV plus assay is intended as an aid in the diagnosis of influenza from Nasopharyngeal swab specimens and should not be used as a sole basis for treatment. Nasal washings and aspirates are unacceptable for Xpert Xpress SARS-CoV-2/FLU/RSV testing.  Fact Sheet for Patients: BloggerCourse.com  Fact Sheet for Healthcare Providers: SeriousBroker.it  This test is not yet approved or cleared by the [place]  FDA and has been authorized for detection and/or diagnosis of SARS-CoV-2 by FDA under an Emergency Use Authorization (EUA). This EUA will remain in effect (meaning this test can be used) for the duration of the COVID-19 declaration under Section 564(b)(1) of the Act, 21 U.S.C. section 360bbb-3(b)(1), unless the authorization is terminated or revoked.     Resp Syncytial Virus by PCR NEGATIVE NEGATIVE Final    Comment: (NOTE) Fact Sheet for Patients: BloggerCourse.com  Fact Sheet for Healthcare Providers: SeriousBroker.it  This test is not yet approved or cleared by the [place]  FDA and has been authorized for detection and/or diagnosis of SARS-CoV-2 by FDA under an Emergency Use Authorization (EUA). This EUA will remain in effect  (meaning this test can be used) for the duration of the COVID-19 declaration under Section 564(b)(1) of the Act, 21 U.S.C. section 360bbb-3(b)(1), unless the authorization is terminated or revoked.  Performed at [facility], [address] [place], [place] [address]   Respiratory (~20 pathogens) panel by PCR     Status: None   Collection Time: 02/10/24  3:09 PM   Specimen: Nasopharyngeal Swab; Respiratory  Result Value Ref Range Status   Adenovirus NOT DETECTED NOT DETECTED Final   Coronavirus 229E NOT DETECTED NOT DETECTED Final    Comment: (NOTE) The Coronavirus on the Respiratory Panel, DOES NOT test for the novel  Coronavirus (2019 nCoV)    Coronavirus HKU1 NOT DETECTED NOT DETECTED Final   Coronavirus NL63 NOT DETECTED NOT DETECTED Final   Coronavirus OC43 NOT DETECTED NOT DETECTED Final   Metapneumovirus NOT DETECTED NOT DETECTED Final   Rhinovirus / Enterovirus NOT DETECTED NOT DETECTED Final   Influenza A NOT DETECTED NOT DETECTED Final   Influenza B NOT DETECTED NOT DETECTED Final   Parainfluenza Virus 1 NOT DETECTED NOT DETECTED Final   Parainfluenza Virus 2 NOT DETECTED NOT DETECTED Final   Parainfluenza Virus 3 NOT DETECTED NOT DETECTED Final   Parainfluenza Virus 4 NOT DETECTED NOT DETECTED Final   Respiratory Syncytial Virus NOT DETECTED NOT DETECTED Final   Bordetella pertussis NOT DETECTED NOT DETECTED Final   Bordetella Parapertussis NOT DETECTED NOT DETECTED Final   Chlamydophila pneumoniae NOT DETECTED NOT DETECTED Final   Mycoplasma pneumoniae NOT DETECTED NOT DETECTED Final    Comment: Performed at [facility], [address] [place], [place] [address]    Radiology Studies: DG Lumbar Spine 2-3 Views Result Date: 02/10/2024 CLINICAL DATA:  Fall, low back pain EXAM: LUMBAR SPINE - 2-3 VIEW COMPARISON:  None Available. FINDINGS: Normal lumbar lordosis. No acute fracture or listhesis. Mild sigmoid  curvature of the lumbar spine. Vertebral body  height is preserved. Intervertebral disc space narrowing and endplate remodeling is seen at L1-L5 in keeping with changes of moderate to severe degenerative disc disease. Paraspinal soft tissues are unremarkable. IMPRESSION: 1. Moderate to severe diffuse degenerative disc disease. No acute fracture or listhesis. Electronically Signed   By: Worthy Heads M.D.   On: 02/10/2024 12:27   DG HIPS BILAT WITH PELVIS 2V Result Date: 02/10/2024 CLINICAL DATA:  Fall, bilateral hip and low back pain EXAM: DG HIP (WITH OR WITHOUT PELVIS) 2V BILAT COMPARISON:  None Available. FINDINGS: Normal alignment. No acute fracture or dislocation. Severe left hip degenerative arthritis with complete loss the joint space, remodeling of the femoral head, and subchondral cyst formation. Mild-to-moderate right hip degenerative arthritis. Sacroiliac joint spaces are preserved. Brachytherapy seeds are seen within the prostate gland. IMPRESSION: 1. Severe left and mild-to-moderate right hip degenerative arthritis. Electronically Signed   By: Worthy Heads M.D.   On: 02/10/2024 12:26   CT Soft Tissue Neck W Contrast Result Date: 02/10/2024 CLINICAL DATA:  Soft tissue infection suspected (Ped 0-17y) EXAM: CT NECK WITH CONTRAST TECHNIQUE: Multidetector CT imaging of the neck was performed using the standard protocol following the bolus administration of intravenous contrast. RADIATION DOSE REDUCTION: This exam was performed according to the departmental dose-optimization program which includes automated exposure control, adjustment of the mA and/or kV according to patient size and/or use of iterative reconstruction technique. CONTRAST:  75mL OMNIPAQUE  IOHEXOL  350 MG/ML SOLN COMPARISON:  None Available. FINDINGS: Pharynx and larynx: Normal. No mass or swelling. Salivary glands: No inflammation, mass, or stone. Thyroid: Normal. Lymph nodes: None enlarged or abnormal density. Vascular: Major arteries are patent in the neck on limited assessment.  Limited intracranial: Negative. Visualized orbits: Negative. Mastoids and visualized paranasal sinuses: Clear. Left mastoidectomy. Skeleton: Multilevel degenerative change including facet and uncovertebral hypertrophy that contributes to varying degrees of neural foraminal stenosis. No acute abnormality on limited assessment. Upper chest: Visualized lung apices are clear. IMPRESSION: No evidence of acute abnormality. Electronically Signed   By: Stevenson Elbe M.D.   On: 02/10/2024 02:50   DG Knee Complete 4 Views Right Result Date: 02/09/2024 CLINICAL DATA:  Recent fall with right knee pain, initial encounter EXAM: RIGHT KNEE - COMPLETE 4+ VIEW COMPARISON:  09/27/2004 FINDINGS: Small joint effusion is noted. Tricompartmental degenerative changes are seen. No acute fracture is identified. IMPRESSION: Degenerative changes with small joint effusion. No acute abnormality noted. Electronically Signed   By: Violeta Grey M.D.   On: 02/09/2024 23:28   DG Chest 2 View Result Date: 02/09/2024 CLINICAL DATA:  Cough.  Patient fell.  Coughing up mucus. EXAM: CHEST - 2 VIEW COMPARISON:  02/08/2024 FINDINGS: Cardiac pacemaker. Shallow inspiration. Heart size and pulmonary vascularity are normal. Lungs are clear. No pleural effusion or pneumothorax. Mediastinal contours appear intact. Degenerative changes in the spine and shoulders. IMPRESSION: Shallow inspiration.  No evidence of active pulmonary disease. Electronically Signed   By: Boyce Byes M.D.   On: 02/09/2024 21:29   Scheduled Meds:  cyanocobalamin  1,000 mcg Intramuscular Daily   enoxaparin (LOVENOX) injection  40 mg Subcutaneous Q24H   Continuous Infusions:  lactated ringers  75 mL/hr at 02/11/24 0950    LOS: 1 day   Aura Leeds, DO [facility] Available via Epic secure chat 7am-7pm After these hours, please refer to coverage provider listed on [email or web address] 02/11/2024, 8:40 PM

## 2024-02-11 NOTE — Progress Notes (Signed)
 PT Cancellation Note  Patient Details Name: Richard Osborne MRN: [id number] DOB: Apr 26, 1934   Cancelled Treatment:    Reason Eval/Treat Not Completed: Medical issues which prohibited therapy. Patient unable to tolerate RLE weight bearing secondary to knee pain during Occupational Therapy Evaluation this morning; MD to order imaging. Will follow up for PT Evaluation pending results.  Aubrii Sharpless, PT, DPT Acute Rehabilitation Services  Personal: Secure Chat Rehab Office: [phone]  Albino Hum 02/11/2024, 9:02 AM

## 2024-02-11 NOTE — Hospital Course (Signed)
 Richard Osborne is a 89 y.o. male with medical history significant of HTN, HLD, hx of prostate cancer, PAF, PPM, CKD stage 3a who presented to ED after being found down at home for 6-7 hours. He has history of atrial fib, but on no blood thinners. Denies hitting his head.    His sister states he has been hallucinating as well. She states he has been seeing things and complaining of someone watching him. She states this has been going on for months. She states he has had a hard time walking for a while with hip pain and joint pain. She doesn't think he has been eating well or taking his medication as he should.  **Interim History:  Right knee very swollen and painful so will get further imaging. PT/OT recommending SNF  Assessment and Plan:  Rhabdomyolysis: 89 year old who had a fall at home due to weakness and was on ground x 6-7 hours found to be in rhabdomyolysis with elevated CK and acute on chronic kidney injury -place in obs on tele. continue IVF and trend CK -CK improved to 351 today; mag/phos wnl -UA with moderate hgb/ 0-5 RBC  -PT/OT eval recommending SNF    Acute renal failure superimposed on stage 3a chronic kidney disease (HCC) Likely secondary to rhabdomyolysis  -BUN/Cr Trend: Recent Labs  Lab 02/05/24 1734 02/08/24 0813 02/09/24 2110 02/10/24 1229 02/11/24 0335  BUN 18 20 27* 25* 23  CREATININE 1.60* 1.51* 1.83* 1.42* 1.39*  -Getting IVF -Avoid Nephrotoxic Medications, Contrast Dyes, Hypotension and Dehydration to Ensure Adequate Renal Perfusion and will need to Renally Adjust Meds -Continue to Monitor and Trend Renal Function carefully and repeat CMP in the AM    SIRS (systemic inflammatory response syndrome) (HCC) -Presented with tachycardia and leukocytosis. Tachycardia resolved with IVF and WBC improved to 15.5  -CXR clear, UA not impressive -He has neck pain but no fever/chills, vision changes/headaches or neck rigidity  -BC and urine cultures collected  -WBC  remains elevated and went from 15.5 -> 15.9 -Received rocephin and azithromycin in ED but no complaints of cough/shortness of breath and CXR clear  -Check RVP/flu/covid/RSV negative -trend PCT and trending down and went from 2.50 -> 1.77 -Right Knee very swollen and painful so will check CT knee given X-Ray showed  degenerative changes with small joint effusion. No acute abnormality noted. -brain MRI ordered. History of hallucinations/paranoia for months that is intermittent. If fever/headache could consider LP    Generalized Weakness: Checked TSH and was 1.577. B12 172 so start IM replacement.  Family reports weakness present for months. Check lumbar xray and hip xray- consider MRI. IVF. PT/OT to eval and recommending SNF SW for placement AL vs. SNF     Neck pain: Sudden onset per patient after eating at [employer]. CT cervical spine and soft tissue neck with no acute findings. CT cervical spine:  Multilevel degenerative change including degenerative disease and facet/uncovertebral hypertrophy with varying degrees of neural foraminal stenosis He has no headache/vision changes, decreased ROM or fevers. No radicular red flags in upper extremities .Continue conservative therapy with heating pad/muscle relaxer    Elevated troponin: Troponin flat, no new EKG changes and no complaints of chest pain  Likely secondary to rhabdomyolysis; On tele, continue to monitor    Memory loss: Having intermittent hallucinations and paranoia for months per family; CXR clear and UA looks clean, no other signs of infection. Check urine culture, PCT (2.50 -> 1.77), trend CBC; No headaches/vision changes/neck rigidity/ ? Dementia vs.  Underlying mood disorder. Ordered Brain MRI, but has PPM. Delirium precautions/fall precautions. If hallucinations persist despite w/u may benefit from Psych evaluation   Benign hypertension: Blood pressure decently controlled.  Continue home norvasc at 5mg  daily. Hold lasix, vasotec with  acute on chronic kidney injury. PRN labetalol. CTM BP per Protocol. Last BP reading    Bladder cancer [facility]): He was found to have a bladder tumor and underwent TURBT on 01/24/17. Final pathology revealed G3Ta. He then underwent induction BCG which he completed on 03/20/17. Second TURBT on 07/18/17 persistent G3Ta TCC and he underwent a second induction course of BCG which he completed on 09/20/17. Bladder biopsies were negative for cancer on 11/21/17. Office cystoscopy was OK on 11/24/22    Dyslipidemia: Hold statin in setting of rhabdomyolysis    History of prostate cancer: LDBT with Iodine-125 for localized adenocarcinoma of the prostate in 2002; Will need outpt F/U   Gastroesophageal reflux disease/GI Prophylaxis: C/w PPI    Hypoalbuminemia: Patient's Albumin trened down and now 2.7. CTM and Trend and repeat CMP in the AM

## 2024-02-12 ENCOUNTER — Inpatient Hospital Stay (HOSPITAL_COMMUNITY)

## 2024-02-12 DIAGNOSIS — N179 Acute kidney failure, unspecified: Secondary | ICD-10-CM | POA: Diagnosis not present

## 2024-02-12 DIAGNOSIS — R509 Fever, unspecified: Secondary | ICD-10-CM

## 2024-02-12 DIAGNOSIS — M25561 Pain in right knee: Secondary | ICD-10-CM

## 2024-02-12 DIAGNOSIS — M25461 Effusion, right knee: Secondary | ICD-10-CM

## 2024-02-12 DIAGNOSIS — M6282 Rhabdomyolysis: Secondary | ICD-10-CM | POA: Diagnosis not present

## 2024-02-12 DIAGNOSIS — R531 Weakness: Secondary | ICD-10-CM | POA: Diagnosis not present

## 2024-02-12 DIAGNOSIS — R6510 Systemic inflammatory response syndrome (SIRS) of non-infectious origin without acute organ dysfunction: Secondary | ICD-10-CM | POA: Diagnosis not present

## 2024-02-12 LAB — CBC WITH DIFFERENTIAL/PLATELET
Abs Immature Granulocytes: 0.09 10*3/uL — ABNORMAL HIGH (ref 0.00–0.07)
Basophils Absolute: 0 10*3/uL (ref 0.0–0.1)
Basophils Relative: 0 %
Eosinophils Absolute: 0 10*3/uL (ref 0.0–0.5)
Eosinophils Relative: 0 %
HCT: 34.7 % — ABNORMAL LOW (ref 39.0–52.0)
Hemoglobin: 11.1 g/dL — ABNORMAL LOW (ref 13.0–17.0)
Immature Granulocytes: 1 %
Lymphocytes Relative: 12 %
Lymphs Abs: 1.8 10*3/uL (ref 0.7–4.0)
MCH: 29.4 pg (ref 26.0–34.0)
MCHC: 32 g/dL (ref 30.0–36.0)
MCV: 92 fL (ref 80.0–100.0)
Monocytes Absolute: 2.4 10*3/uL — ABNORMAL HIGH (ref 0.1–1.0)
Monocytes Relative: 16 %
Neutro Abs: 10.9 10*3/uL — ABNORMAL HIGH (ref 1.7–7.7)
Neutrophils Relative %: 71 %
Platelets: 256 10*3/uL (ref 150–400)
RBC: 3.77 MIL/uL — ABNORMAL LOW (ref 4.22–5.81)
RDW: 12.8 % (ref 11.5–15.5)
WBC: 15.2 10*3/uL — ABNORMAL HIGH (ref 4.0–10.5)
nRBC: 0 % (ref 0.0–0.2)

## 2024-02-12 LAB — IRON AND TIBC
Iron: 18 ug/dL — ABNORMAL LOW (ref 45–182)
Saturation Ratios: 9 % — ABNORMAL LOW (ref 17.9–39.5)
TIBC: 211 ug/dL — ABNORMAL LOW (ref 250–450)
UIBC: 193 ug/dL

## 2024-02-12 LAB — COMPREHENSIVE METABOLIC PANEL WITH GFR
ALT: 29 U/L (ref 0–44)
AST: 42 U/L — ABNORMAL HIGH (ref 15–41)
Albumin: 2.5 g/dL — ABNORMAL LOW (ref 3.5–5.0)
Alkaline Phosphatase: 49 U/L (ref 38–126)
Anion gap: 11 (ref 5–15)
BUN: 23 mg/dL (ref 8–23)
CO2: 21 mmol/L — ABNORMAL LOW (ref 22–32)
Calcium: 8.8 mg/dL — ABNORMAL LOW (ref 8.9–10.3)
Chloride: 107 mmol/L (ref 98–111)
Creatinine, Ser: 1.45 mg/dL — ABNORMAL HIGH (ref 0.61–1.24)
GFR, Estimated: 47 mL/min — ABNORMAL LOW (ref >60)
Glucose, Bld: 96 mg/dL (ref 70–99)
Potassium: 4.4 mmol/L (ref 3.5–5.1)
Sodium: 139 mmol/L (ref 135–145)
Total Bilirubin: 0.8 mg/dL (ref 0.0–1.2)
Total Protein: 6.3 g/dL — ABNORMAL LOW (ref 6.5–8.1)

## 2024-02-12 LAB — RETICULOCYTES
Immature Retic Fract: 9.8 % (ref 2.3–15.9)
RBC.: 3.79 MIL/uL — ABNORMAL LOW (ref 4.22–5.81)
Retic Count, Absolute: 34.5 10*3/uL (ref 19.0–186.0)
Retic Ct Pct: 0.9 % (ref 0.4–3.1)

## 2024-02-12 LAB — URINE CULTURE: Culture: NO GROWTH

## 2024-02-12 LAB — SYNOVIAL CELL COUNT + DIFF, W/ CRYSTALS
Eosinophils-Synovial: 0 % (ref 0–1)
Lymphocytes-Synovial Fld: 0 % (ref 0–20)
Monocyte-Macrophage-Synovial Fluid: 16 % — ABNORMAL LOW (ref 50–90)
Neutrophil, Synovial: 84 % — ABNORMAL HIGH (ref 0–25)
WBC, Synovial: 5350 /mm3 — ABNORMAL HIGH (ref 0–200)

## 2024-02-12 LAB — FOLATE: Folate: 12.1 ng/mL (ref >5.9)

## 2024-02-12 LAB — PHOSPHORUS: Phosphorus: 3.8 mg/dL (ref 2.5–4.6)

## 2024-02-12 LAB — MAGNESIUM: Magnesium: 1.9 mg/dL (ref 1.7–2.4)

## 2024-02-12 LAB — PROCALCITONIN: Procalcitonin: 1.1 ng/mL

## 2024-02-12 LAB — CK: Total CK: 479 U/L — ABNORMAL HIGH (ref 49–397)

## 2024-02-12 LAB — VITAMIN B12: Vitamin B-12: 4454 pg/mL — ABNORMAL HIGH (ref 180–914)

## 2024-02-12 LAB — FERRITIN: Ferritin: 641 ng/mL — ABNORMAL HIGH (ref 24–336)

## 2024-02-12 MED ORDER — VANCOMYCIN HCL IN DEXTROSE 1-5 GM/200ML-% IV SOLN
1000.0000 mg | INTRAVENOUS | Status: DC
  Administered 2024-02-13: 1000 mg via INTRAVENOUS
  Filled 2024-02-12: qty 200

## 2024-02-12 MED ORDER — LIDOCAINE HCL 1 % IJ SOLN
5.0000 mL | Freq: Once | INTRAMUSCULAR | Status: DC
  Filled 2024-02-12: qty 5

## 2024-02-12 MED ORDER — LIDOCAINE HCL (PF) 1 % IJ SOLN
5.0000 mL | Freq: Once | INTRAMUSCULAR | Status: DC
  Filled 2024-02-12: qty 5

## 2024-02-12 MED ORDER — SODIUM CHLORIDE 0.9 % IV SOLN
2.0000 g | Freq: Two times a day (BID) | INTRAVENOUS | Status: DC
  Administered 2024-02-12 – 2024-02-14 (×4): 2 g via INTRAVENOUS
  Filled 2024-02-12 (×3): qty 12.5

## 2024-02-12 MED ORDER — SODIUM CHLORIDE 0.9 % IV SOLN
2.0000 g | Freq: Two times a day (BID) | INTRAVENOUS | Status: DC
  Filled 2024-02-12: qty 12.5

## 2024-02-12 MED ORDER — DOCUSATE SODIUM 100 MG PO CAPS
100.0000 mg | ORAL_CAPSULE | Freq: Two times a day (BID) | ORAL | Status: DC
  Administered 2024-02-12 – 2024-02-16 (×6): 100 mg via ORAL
  Filled 2024-02-12 (×7): qty 1

## 2024-02-12 MED ORDER — LORATADINE 10 MG PO TABS
10.0000 mg | ORAL_TABLET | Freq: Every day | ORAL | Status: DC
  Administered 2024-02-12 – 2024-02-16 (×5): 10 mg via ORAL
  Filled 2024-02-12 (×5): qty 1

## 2024-02-12 MED ORDER — LACTATED RINGERS IV SOLN: INTRAVENOUS | Status: AC

## 2024-02-12 MED ORDER — AMLODIPINE BESYLATE 5 MG PO TABS
5.0000 mg | ORAL_TABLET | Freq: Every day | ORAL | Status: DC
  Administered 2024-02-12 – 2024-02-16 (×5): 5 mg via ORAL
  Filled 2024-02-12 (×5): qty 1

## 2024-02-12 MED ORDER — DICLOFENAC SODIUM 1 % EX GEL
2.0000 g | Freq: Four times a day (QID) | CUTANEOUS | Status: DC | PRN
  Administered 2024-02-13: 2 g via TOPICAL
  Filled 2024-02-12: qty 100

## 2024-02-12 MED ORDER — PANTOPRAZOLE SODIUM 40 MG PO TBEC
40.0000 mg | DELAYED_RELEASE_TABLET | Freq: Every day | ORAL | Status: DC
  Administered 2024-02-12 – 2024-02-16 (×5): 40 mg via ORAL
  Filled 2024-02-12 (×5): qty 1

## 2024-02-12 MED ORDER — VANCOMYCIN HCL 1500 MG/300ML IV SOLN
1500.0000 mg | Freq: Once | INTRAVENOUS | Status: AC
  Administered 2024-02-12: 1500 mg via INTRAVENOUS
  Filled 2024-02-12: qty 300

## 2024-02-12 NOTE — Plan of Care (Signed)

## 2024-02-12 NOTE — Progress Notes (Addendum)
 Transferred patient to MRI d/t Pacemaker.  VS remained stable for transfer, will continue to monitor patient while in MRI.   HR 110 on monitor, pt reports chronic neck pain.   10:40 Patient tolerated MRI without any problems.  HR remained 115 during procedure.  Patient reporting chronic pain in neck, will reach out to bedside RN. Patient to be transferred back to 6N with bedside tele monitor on.

## 2024-02-12 NOTE — Consult Note (Signed)
 Reason for Consult: Right knee pain Referring Physician: [facility]  Richard Osborne is an 89 y.o. male.  HPI: Natron is currently admitted the hospitalist and undergoing treatment for sepsis, rhabdomyolysis, and renal failure amongst other issues.  He is on IV antibiotics and notes persistent right knee pain and swelling.  He continues to spike fevers and labs reveal continued leukocytosis.  There is concern that infection source source may be coming from his right knee and orthopedics was consulted.  Presently, patient appears comfortable.  He does complain of right knee pain that is worse with movement.  It does not hurt at rest.  He also has similar discomfort to a lesser degree in the contralateral knee.  He also notes some neck pain and low back pain with ambulation.  He states he has a diagnosis of advanced arthritis in both knees and is underwent some conservative treatment elsewhere but he cannot recall exactly what.  He describes some injections as well as oral NSAIDs.  He has underwent radiographs and CT evaluation of his knee since admission which shows advanced bone-on-bone degenerative changes with an associated effusion.  He is having trouble mobilizing due to his knee pain.  He has noticed recent onset of weakness in the bilateral lower extremities which is also causing him difficulty with mobilization.  He denies any numbness, tingling, or radiating type discomfort.  Past Medical History:  Diagnosis Date   High cholesterol    Hypertension    Meniere's disease    Prostate cancer [facility])     Past Surgical History:  Procedure Laterality Date   otic nerve surgery     PACEMAKER IMPLANT      History reviewed. No pertinent family history.  Social History:  reports that he has never smoked. He has never used smokeless tobacco. He reports that he does not drink alcohol and does not use drugs.  Allergies:  Allergies  Allergen Reactions   Atorvastatin Other (See Comments)     Muscle pain    Fluvastatin Other (See Comments)    Muscle pain    Meloxicam Hypertension   Salsalate Other (See Comments)    Patient cannot recall    Simvastatin Other (See Comments)    Muscle pain     Medications: I have reviewed the patient's current medications.  Results for orders placed or performed during the hospital encounter of 02/09/24 (from the past 48 hours)  Procalcitonin     Status: None   Collection Time: 02/11/24  3:35 AM  Result Value Ref Range   Procalcitonin 1.77 ng/mL    Comment:        Interpretation: PCT > 0.5 ng/mL and <= 2 ng/mL: Systemic infection (sepsis) is possible, but other conditions are known to elevate PCT as well. (NOTE)       Sepsis PCT Algorithm           Lower Respiratory Tract                                      Infection PCT Algorithm    ----------------------------     ----------------------------         PCT < 0.25 ng/mL                PCT < 0.10 ng/mL          Strongly encourage  Strongly discourage   discontinuation of antibiotics    initiation of antibiotics    ----------------------------     -----------------------------       PCT 0.25 - 0.50 ng/mL            PCT 0.10 - 0.25 ng/mL               OR       >80% decrease in PCT            Discourage initiation of                                            antibiotics      Encourage discontinuation           of antibiotics    ----------------------------     -----------------------------         PCT >= 0.50 ng/mL              PCT 0.26 - 0.50 ng/mL                AND       <80% decrease in PCT             Encourage initiation of                                             antibiotics       Encourage continuation           of antibiotics    ----------------------------     -----------------------------        PCT >= 0.50 ng/mL                  PCT > 0.50 ng/mL               AND         increase in PCT                  Strongly encourage                                       initiation of antibiotics    Strongly encourage escalation           of antibiotics                                     -----------------------------                                           PCT <= 0.25 ng/mL                                                 OR                                        >  80% decrease in PCT                                      Discontinue / Do not initiate                                             antibiotics  Performed at [facility], [address], [place], [place] [address]   CK     Status: None   Collection Time: 02/11/24  3:35 AM  Result Value Ref Range   Total CK 351 49 - 397 U/L    Comment: Performed at [facility], [address], [place], [place] [address]  Comprehensive metabolic panel     Status: Abnormal   Collection Time: 02/11/24  3:35 AM  Result Value Ref Range   Sodium 141 135 - 145 mmol/L   Potassium 4.4 3.5 - 5.1 mmol/L   Chloride 110 98 - 111 mmol/L   CO2 22 22 - 32 mmol/L   Glucose, Bld 118 (H) 70 - 99 mg/dL    Comment: Glucose reference range applies only to samples taken after fasting for at least 8 hours.   BUN 23 8 - 23 mg/dL   Creatinine, Ser 0.98 (H) 0.61 - 1.24 mg/dL   Calcium 8.8 (L) 8.9 - 10.3 mg/dL   Total Protein 6.5 6.5 - 8.1 g/dL   Albumin 2.7 (L) 3.5 - 5.0 g/dL   AST 46 (H) 15 - 41 U/L   ALT 23 0 - 44 U/L   Alkaline Phosphatase 43 38 - 126 U/L   Total Bilirubin 0.9 0.0 - 1.2 mg/dL   GFR, Estimated 49 (L) >60 mL/min    Comment: (NOTE) Calculated using the CKD-EPI Creatinine Equation (2021)    Anion gap 9 5 - 15    Comment: Performed at [facility], [address], [place], [place] [address]  CBC     Status: Abnormal   Collection Time: 02/11/24  3:35 AM  Result Value Ref Range   WBC 15.9 (H) 4.0 - 10.5 K/uL   RBC 3.83 (L) 4.22 - 5.81 MIL/uL   Hemoglobin 11.2 (L) 13.0 - 17.0 g/dL   HCT 78.2 (L) 95.6 - 21.3 %   MCV 92.2 80.0 - 100.0 fL   MCH 29.2 26.0 - 34.0 pg   MCHC 31.7 30.0  - 36.0 g/dL   RDW 08.6 57.8 - 46.9 %   Platelets 237 150 - 400 K/uL   nRBC 0.0 0.0 - 0.2 %    Comment: Performed at [facility], [address] [place], [place] [address]  Procalcitonin     Status: None   Collection Time: 02/12/24  7:13 AM  Result Value Ref Range   Procalcitonin 1.10 ng/mL    Comment:        Interpretation: PCT > 0.5 ng/mL and <= 2 ng/mL: Systemic infection (sepsis) is possible, but other conditions are known to elevate PCT as well. (NOTE)       Sepsis PCT Algorithm           Lower Respiratory Tract  Infection PCT Algorithm    ----------------------------     ----------------------------         PCT < 0.25 ng/mL                PCT < 0.10 ng/mL          Strongly encourage             Strongly discourage   discontinuation of antibiotics    initiation of antibiotics    ----------------------------     -----------------------------       PCT 0.25 - 0.50 ng/mL            PCT 0.10 - 0.25 ng/mL               OR       >80% decrease in PCT            Discourage initiation of                                            antibiotics      Encourage discontinuation           of antibiotics    ----------------------------     -----------------------------         PCT >= 0.50 ng/mL              PCT 0.26 - 0.50 ng/mL                AND       <80% decrease in PCT             Encourage initiation of                                             antibiotics       Encourage continuation           of antibiotics    ----------------------------     -----------------------------        PCT >= 0.50 ng/mL                  PCT > 0.50 ng/mL               AND         increase in PCT                  Strongly encourage                                      initiation of antibiotics    Strongly encourage escalation           of antibiotics                                     -----------------------------                                            PCT <= 0.25 ng/mL  OR                                        > 80% decrease in PCT                                      Discontinue / Do not initiate                                             antibiotics  Performed at [facility], [address], [place], [place] [address]   CK     Status: Abnormal   Collection Time: 02/12/24  7:14 AM  Result Value Ref Range   Total CK 479 (H) 49 - 397 U/L    Comment: Performed at [facility], [address], [place], [place] [address]  CBC with Differential/Platelet     Status: Abnormal   Collection Time: 02/12/24  7:14 AM  Result Value Ref Range   WBC 15.2 (H) 4.0 - 10.5 K/uL   RBC 3.77 (L) 4.22 - 5.81 MIL/uL   Hemoglobin 11.1 (L) 13.0 - 17.0 g/dL   HCT 10.2 (L) 72.5 - 36.6 %   MCV 92.0 80.0 - 100.0 fL   MCH 29.4 26.0 - 34.0 pg   MCHC 32.0 30.0 - 36.0 g/dL   RDW 44.0 34.7 - 42.5 %   Platelets 256 150 - 400 K/uL   nRBC 0.0 0.0 - 0.2 %   Neutrophils Relative % 71 %   Neutro Abs 10.9 (H) 1.7 - 7.7 K/uL   Lymphocytes Relative 12 %   Lymphs Abs 1.8 0.7 - 4.0 K/uL   Monocytes Relative 16 %   Monocytes Absolute 2.4 (H) 0.1 - 1.0 K/uL   Eosinophils Relative 0 %   Eosinophils Absolute 0.0 0.0 - 0.5 K/uL   Basophils Relative 0 %   Basophils Absolute 0.0 0.0 - 0.1 K/uL   Immature Granulocytes 1 %   Abs Immature Granulocytes 0.09 (H) 0.00 - 0.07 K/uL    Comment: Performed at [facility], [address], [place], [place] [address]  Comprehensive metabolic panel with GFR     Status: Abnormal   Collection Time: 02/12/24  7:14 AM  Result Value Ref Range   Sodium 139 135 - 145 mmol/L   Potassium 4.4 3.5 - 5.1 mmol/L   Chloride 107 98 - 111 mmol/L   CO2 21 (L) 22 - 32 mmol/L   Glucose, Bld 96 70 - 99 mg/dL    Comment: Glucose reference range applies only to samples taken after fasting for at least 8 hours.   BUN 23 8 - 23 mg/dL   Creatinine, Ser 7.56 (H) 0.61 - 1.24 mg/dL    Calcium 8.8 (L) 8.9 - 10.3 mg/dL   Total Protein 6.3 (L) 6.5 - 8.1 g/dL   Albumin 2.5 (L) 3.5 - 5.0 g/dL   AST 42 (H) 15 - 41 U/L   ALT 29 0 - 44 U/L   Alkaline Phosphatase 49 38 - 126 U/L   Total Bilirubin 0.8 0.0 - 1.2 mg/dL   GFR, Estimated 47 (L) >60 mL/min    Comment: (NOTE) Calculated using the CKD-EPI Creatinine  Equation (2021)    Anion gap 11 5 - 15    Comment: Performed at [facility] Lab, [address] [place], [place] [address]  Magnesium      Status: None   Collection Time: 02/12/24  7:14 AM  Result Value Ref Range   Magnesium  1.9 1.7 - 2.4 mg/dL    Comment: Performed at [facility], [address] [place], [place] [address]  Phosphorus     Status: None   Collection Time: 02/12/24  7:14 AM  Result Value Ref Range   Phosphorus 3.8 2.5 - 4.6 mg/dL    Comment: Performed at [facility], [address] [place], [place] [address]  Vitamin B12     Status: Abnormal   Collection Time: 02/12/24  7:14 AM  Result Value Ref Range   Vitamin B-12 4,454 (H) 180 - 914 pg/mL    Comment: (NOTE) This assay is not validated for testing neonatal or myeloproliferative syndrome specimens for Vitamin B12 levels. Performed at [facility] Lab, [address] [place], [place] [address]   Folate     Status: None   Collection Time: 02/12/24  7:14 AM  Result Value Ref Range   Folate 12.1 >5.9 ng/mL    Comment: Performed at [facility], [address] [place], [place] [address]  Iron and TIBC     Status: Abnormal   Collection Time: 02/12/24  7:14 AM  Result Value Ref Range   Iron 18 (L) 45 - 182 ug/dL   TIBC 528 (L) 413 - 244 ug/dL   Saturation Ratios 9 (L) 17.9 - 39.5 %   UIBC 193 ug/dL    Comment: Performed at [facility], [address], [place], [place] [address]  Ferritin     Status: Abnormal   Collection Time: 02/12/24  7:14 AM  Result Value Ref Range   Ferritin 641 (H) 24 - 336 ng/mL    Comment: Performed at [facility], [address] [place], [place] [address]  Reticulocytes     Status: Abnormal   Collection Time: 02/12/24  7:14 AM  Result Value Ref Range   Retic Ct Pct 0.9 0.4 - 3.1 %   RBC. 3.79 (L) 4.22 - 5.81 MIL/uL   Retic Count, Absolute 34.5 19.0 - 186.0 K/uL   Immature Retic Fract 9.8 2.3 - 15.9 %    Comment: Performed at [facility], [address] [place], [place] [address]    CT KNEE RIGHT W CONTRAST Result Date: 02/12/2024 CLINICAL DATA:  Knee pain. EXAM: CT OF THE RIGHT KNEE WITH CONTRAST TECHNIQUE: Multidetector CT imaging was performed following the standard protocol during bolus administration of intravenous contrast. RADIATION DOSE REDUCTION: This exam was performed according to the departmental dose-optimization program which includes automated exposure control, adjustment of the mA and/or kV according to patient size and/or use of iterative reconstruction technique. CONTRAST:  75mL OMNIPAQUE  IOHEXOL  350 MG/ML SOLN COMPARISON:  Right knee radiographs dated 02/09/2024. FINDINGS: Bones/Joint/Cartilage No acute fracture or dislocation. Severe lateral patellofemoral joint space narrowing resulting in bone-on-bone contact, subchondral cystic change, and osteophytosis. Moderate lateral femorotibial compartment joint space narrowing. Amorphous mineralization within the medial and lateral femorotibial compartments, may reflect chondrocalcinosis. Moderate size knee joint effusion. Ligaments Ligaments are suboptimally evaluated by CT. Soft tissue and Muscles Partially visualized fatty atrophy of the medial gastrocnemius muscle. No intramuscular fluid collection or hematoma. IMPRESSION: 1. No acute osseous abnormality. 2. Moderate to severe  tricompartmental degenerative changes of the knee with chondrocalcinosis, as above. 3. Moderate knee joint effusion. Electronically Signed   By: Mannie Seek M.D.   On: 02/12/2024 08:31    Review of Systems  Constitutional:  Positive for fever.  HENT:  Negative for  drooling, facial swelling, nosebleeds and sneezing.   Eyes:  Negative for discharge, redness and visual disturbance.  Respiratory:  Negative for choking and shortness of breath.   Skin:  Negative for wound.  Neurological:  Negative for seizures and facial asymmetry.  Psychiatric/Behavioral:  Negative for agitation.    Blood pressure (!) 157/81, pulse (!) 114, temperature (!) 101.1 F (38.4 C), temperature source Oral, resp. rate (!) 22, height 5\' 10"  (1.778 m), weight 73.5 kg, SpO2 100%. Physical Exam Constitutional:      Comments: Frail elderly male  HENT:     Head: Normocephalic and atraumatic.     Nose: Nose normal.     Mouth/Throat:     Mouth: Mucous membranes are moist.  Eyes:     General: No scleral icterus.    Extraocular Movements: Extraocular movements intact.  Pulmonary:     Effort: Pulmonary effort is normal. No respiratory distress.  Abdominal:     General: Abdomen is flat.  Musculoskeletal:        General: Swelling present.     Comments: Examination of the bilateral lower extremity shows right greater left tenderness to palpation about the anterior knee.  +1 effusion on the right with trace on the left.  Some warmth but no erythema noted.  Right knee range of motion is approximately 5 to 90 degrees of flexion with pain throughout the movement.  Left knee 0 to 90 degrees with mild discomfort throughout the movement.  Knees are stable to varus and valgus stress testing.  He has pain with passive internal and external rotation of the hip worse on the left than the right.  He is unable to do a straight leg raise bilaterally due to apparent weakness.  Endorses intact sensation light touch throughout.  Calves are soft and nontender.  Warm and well-perfused distally.  Skin appears benign.    He is able to range his upper extremities without limitation or difficulty.  He does note some stiffness in the neck and discomfort with range of motion.  Skin:    General: Skin is warm and  dry.     Findings: No erythema.  Neurological:     General: No focal deficit present.     Mental Status: He is alert.     Assessment/Plan: Right knee advanced degenerative change with effusion in a patient who currently being treated for sepsis Bilateral hip degenerative changes Bilateral lower extremity weakness etiology unclear  Patient does have notable right knee pain, stiffness, and an effusion.  He has advanced arthritic change in this knee on imaging including the bone-on-bone in lateral compartment and patellofemoral joint.  I think his symptoms are more likely related to degenerative changes and not a septic etiology.  He was agreeable to proceeding with a right knee arthrocentesis to rule out septic arthritis and hopefully decompress the joint.  This may be helpful for pain control and mobilization. If he continues to struggle could consider corticosteroid injection to help with mobilization so long micro workup is negative.  Procedure: Risk, benefits, and alternatives to the procedure were discussed at length.  Informed verbal consent was obtained.  The right knee was sterilely cleansed with Betadine and alcohol.  Using a superior lateral approach  under sterile technique, 5 cc of 1% lidocaine  without epinephrine was infiltrated in the subcutaneous tissue down to the joint lining.  The needle was removed.  Subsequently along the same tract a 18-gauge 1-1/2 inch needle with a 60 cc syringe was used to aspirate 45cc of slightly hazy blood-tinged synovial fluid.  The needle was withdrawn and a Band-Aid was applied.  Patient tolerated procedure well.  No complications.  The aspirate was handed off to the nursing staff and sent for culture, crystals, cell count, and Gram stain.  Amee Boothe J [place] 02/12/2024, 6:06 PM

## 2024-02-12 NOTE — Progress Notes (Signed)
   02/12/24 1556  Vitals  Temp (!) 101.1 F (38.4 C)  Temp Source Oral  BP (!) 157/81  MAP (mmHg) 103  BP Location Left Arm  BP Method Automatic  Patient Position (if appropriate) Lying  Pulse Rate (!) 114  Pulse Rate Source Monitor  Resp 16  MEWS COLOR  MEWS Score Color Yellow  Oxygen Therapy  SpO2 100 %  O2 Device Room Air  MEWS Score  MEWS Temp 1  MEWS Systolic 0  MEWS Pulse 2  MEWS RR 0  MEWS LOC 0  MEWS Score 3  Provider Notification  Provider Name/Title [facility]  Date Provider Notified 02/12/24  Time Provider Notified 1602  Method of Notification Page  Notification Reason Change in status   MD notified

## 2024-02-12 NOTE — Progress Notes (Signed)
 Pharmacy Antibiotic Note  Richard Osborne is a 89 y.o. male admitted on 02/09/2024 with sepsis.  Pharmacy has been consulted for vancomycin and cefepime dosing.  Plan: Vancomcyin 1500mg  IV x1 then 1000mg  IV q24 hours (eAUC 541, scr 1.45, Vd 0.72)( Cefepime 2G IV q12 hours  Height: 5\' 10"  (177.8 cm) Weight: 73.5 kg (162 lb) IBW/kg (Calculated) : 73  Temp (24hrs), Avg:99.1 F (37.3 C), Min:97.8 F (36.6 C), Max:101.1 F (38.4 C)  Recent Labs  Lab 02/08/24 0813 02/09/24 2110 02/09/24 2115 02/10/24 0048 02/10/24 1229 02/11/24 0335 02/12/24 0714  WBC 13.0* 17.0*  --   --  15.5* 15.9* 15.2*  CREATININE 1.51* 1.83*  --   --  1.42* 1.39* 1.45*  LATICACIDVEN  --   --  2.1* 1.2  --   --   --     Estimated Creatinine Clearance: 37.1 mL/min (A) (by C-G formula based on SCr of 1.45 mg/dL (H)).    Allergies  Allergen Reactions   Atorvastatin Other (See Comments)    Muscle pain    Fluvastatin Other (See Comments)    Muscle pain    Meloxicam Hypertension   Salsalate Other (See Comments)    Patient cannot recall    Simvastatin Other (See Comments)    Muscle pain      Thank you for allowing pharmacy to be a part of this patient's care.  Richard Osborne 02/12/2024 4:19 PM

## 2024-02-12 NOTE — Evaluation (Signed)
 Physical Therapy Evaluation Patient Details Name: Richard Osborne MRN: [id number] DOB: 08-27-1934 Today's Date: 02/12/2024  History of Present Illness  Pt is an 89 y/o M presenting to ED on 5/2 after being found on floor, down x6-7 hours, found to have rhabdomyolysis with elevated CK and acute on chronic kidney injury. PMH includes prostate CA, HTN, hypercholesteremia, meniere's disease.  Clinical Impression   Pt presents with severe R knee pain and swelling, impaired balance with history of falls, generalized weakness, and impaired activity tolerance. Pt to benefit from acute PT to address deficits. Pt requiring max +2 to move to/from EOB, pt limited by RLE pain and tachycardia 114-137 bpm. Patient will benefit from continued inpatient follow up therapy, <3 hours/day. PT to progress mobility as tolerated, and will continue to follow acutely.          If plan is discharge home, recommend the following: Two people to help with walking and/or transfers;Two people to help with bathing/dressing/bathroom   Can travel by private vehicle   No    Equipment Recommendations None recommended by PT  Recommendations for Other Services       Functional Status Assessment Patient has had a recent decline in their functional status and demonstrates the ability to make significant improvements in function in a reasonable and predictable amount of time.     Precautions / Restrictions Precautions Precautions: Fall Restrictions Weight Bearing Restrictions Per Provider Order: No      Mobility  Bed Mobility Overal bed mobility: Needs Assistance Bed Mobility: Supine to Sit, Sit to Supine     Supine to sit: Max assist, +2 for physical assistance Sit to supine: Max assist, +2 for physical assistance, HOB elevated   General bed mobility comments: assist for LE and trunk management, scooting to/from EOB, and boost up in bed upon return to supine.    Transfers                   General  transfer comment: unable, given severe pain sitting EOB and HR elevating to 133 bpm sitting EOB    Ambulation/Gait                  Stairs            Wheelchair Mobility     Tilt Bed    Modified Rankin (Stroke Patients Only)       Balance Overall balance assessment: Needs assistance Sitting-balance support: Feet supported Sitting balance-Leahy Scale: Fair                                       Pertinent Vitals/Pain Pain Assessment Pain Assessment: Faces Faces Pain Scale: Hurts little more Pain Location: R knee Pain Descriptors / Indicators: Discomfort, Grimacing, Guarding Pain Intervention(s): Limited activity within patient's tolerance, Monitored during session, Repositioned    Home Living Family/patient expects to be discharged to:: Private residence Living Arrangements: Alone Available Help at Discharge: Family;Friend(s);Available PRN/intermittently Type of Home: House Home Access: Level entry       Home Layout: One level Home Equipment: Cane - single Librarian, academic (2 wheels);Rollator (4 wheels);Shower seat;Grab bars - tub/shower      Prior Function Prior Level of Function : Needs assist             Mobility Comments: ind without AD, reports recent use of SPC at times ADLs Comments: ind with ADLs, sometimes makes simple meals (  bkfst) -- goes out to eat lunch/dinner, drives, manages own meds     Extremity/Trunk Assessment   Upper Extremity Assessment Upper Extremity Assessment: Defer to OT evaluation    Lower Extremity Assessment Lower Extremity Assessment: Generalized weakness;RLE deficits/detail RLE Deficits / Details: holds knee in 15 deg knee flexion and unable to get RLE into full extension secondary to guarding RLE: Unable to fully assess due to pain    Cervical / Trunk Assessment Cervical / Trunk Assessment: Normal  Communication   Communication Communication: No apparent difficulties    Cognition  Arousal: Alert Behavior During Therapy: Anxious   PT - Cognitive impairments: No apparent impairments                       PT - Cognition Comments: anxious about mobility secondary to pain Following commands: Intact       Cueing Cueing Techniques: Verbal cues     General Comments General comments (skin integrity, edema, etc.): HR 114-137 bpm, RN notified    Exercises     Assessment/Plan    PT Assessment Patient needs continued PT services  PT Problem List Decreased strength;Decreased mobility;Decreased activity tolerance;Decreased balance;Decreased knowledge of use of DME;Pain;Cardiopulmonary status limiting activity;Decreased safety awareness;Decreased knowledge of precautions;Decreased range of motion       PT Treatment Interventions DME instruction;Therapeutic activities;Therapeutic exercise;Gait training;Patient/family education;Balance training;Neuromuscular re-education;Functional mobility training    PT Goals (Current goals can be found in the Care Plan section)  Acute Rehab PT Goals PT Goal Formulation: With patient Time For Goal Achievement: 02/26/24 Potential to Achieve Goals: Good    Frequency Min 2X/week     Co-evaluation               AM-PAC PT "6 Clicks" Mobility  Outcome Measure Help needed turning from your back to your side while in a flat bed without using bedrails?: A Lot Help needed moving from lying on your back to sitting on the side of a flat bed without using bedrails?: A Lot Help needed moving to and from a bed to a chair (including a wheelchair)?: Total Help needed standing up from a chair using your arms (e.g., wheelchair or bedside chair)?: Total Help needed to walk in hospital room?: Total Help needed climbing 3-5 steps with a railing? : Total 6 Click Score: 8    End of Session   Activity Tolerance: Patient limited by pain;Treatment limited secondary to medical complications (Comment) (tachycardia) Patient left: in  bed;with call bell/phone within reach;with bed alarm set Nurse Communication: Mobility status;Other (comment) (tachycardia) PT Visit Diagnosis: Other abnormalities of gait and mobility (R26.89);Muscle weakness (generalized) (M62.81);Pain Pain - Right/Left: Right Pain - part of body: Leg;Knee    Time: 1610-9604 PT Time Calculation (min) (ACUTE ONLY): 18 min   Charges:   PT Evaluation $PT Eval Low Complexity: 1 Low   PT General Charges $$ ACUTE PT VISIT: 1 Visit         Shirlene Doughty, PT DPT Acute Rehabilitation Services Secure Chat Preferred  Office [phone]   Loralye Loberg Cydney Draft 02/12/2024, 4:46 PM

## 2024-02-12 NOTE — Progress Notes (Signed)
 PROGRESS NOTE    JACOBI Osborne  UJW:[id number] DOB: Oct 28, 1933 DOA: 02/09/2024 PCP: [name], MD   Brief Narrative:  Richard Osborne is a 89 y.o. male with medical history significant of HTN, HLD, hx of prostate cancer, PAF, PPM, CKD stage 3a who presented to ED after being found down at home for 6-7 hours. He has history of atrial fib, but on no blood thinners. Denies hitting his head.    His sister states he has been hallucinating as well. She states he has been seeing things and complaining of someone watching him. She states this has been going on for months. She states he has had a hard time walking for a while with hip pain and joint pain. She doesn't think he has been eating well or taking his medication as he should.  **Interim History:  Right knee very swollen and painful so will get further imaging and showed moderate effusion. Spiked a Temperature of 101.1 today so will repeat blood Cx and start Abx. PT/OT recommending SNF  Assessment and Plan:  Rhabdomyolysis: 89 year old who had a fall at home due to weakness and was on ground x 6-7 hours found to be in rhabdomyolysis with elevated CK and acute on chronic kidney injury -place in obs on tele. continue IVF and trend CK -CK improved to 351 but now worsened to 479; mag/phos wnl -UA with moderate hgb/ 0-5 RBC  -PT/OT eval recommending SNF    Acute renal failure superimposed on stage 3a chronic kidney disease (HCC) Likely secondary to rhabdomyolysis; BUN/Cr Trend: Recent Labs  Lab 02/05/24 1734 02/08/24 0813 02/09/24 2110 02/10/24 1229 02/11/24 0335  BUN 18 20 27* 25* 23  CREATININE 1.60* 1.51* 1.83* 1.42* 1.39*  -Getting IVF and renew @ 75 mL/hr x1 Day -Avoid Nephrotoxic Medications, Contrast Dyes, Hypotension and Dehydration to Ensure Adequate Renal Perfusion and will need to Renally Adjust Meds -Continue to Monitor and Trend Renal Function carefully and repeat CMP in the AM    Sepsis with unclear etiology but ?  Right Knee; r/o other Causes -Presented with tachycardia and leukocytosis. Tachycardia resolved with IVF and WBC improved to 15.2 -CXR clear, UA not impressive -He has neck pain but no fever/chills, vision changes/headaches or neck rigidity  -BC and urine cultures collected; Repeat Blood Cx on 02/12/24 -WBC remains elevated and went from 15.5 -> 15.9 -> 15.2 -Received rocephin and azithromycin in ED but no complaints of cough/shortness of breath and CXR clear  -Check RVP/flu/covid/RSV negative -trend PCT and trending down and went from 2.50 -> 1.77 -> 1.10 -Right Knee very swollen and painful so will check CT knee given X-Ray showed  degenerative changes with small joint effusion. No acute abnormality noted.; CT scan done and showed  No acute osseous abnormality but did show Moderate to severe tricompartmental degenerative changes of the knee with chondrocalcinosis, as above and a Moderate knee joint effusion. -Orthopedic Consulted for drainage and patient underwent knee aspiration that was sent for culture, crystals and cell count as well as Gram stain. -brain MRI done and showed no evidence of acute intracranial abnormality. History of hallucinations/paranoia for months that is intermittent.  Now that he is spiking temperatures and continues to have some neck pain may need a LP and will discuss with neurology   Generalized Weakness: Checked TSH and was 1.577. B12 172 so start IM replacement.  Family reports weakness present for months. Check lumbar xray showed Moderate to severe diffuse degenerative disc disease. No acute fracture  or listhesis. Hip X-ray showed Severe left and mild-to-moderate right hip degenerative arthritis. Will consider MRI. IVF. PT/OT to eval and recommending SNF    Neck pain: Sudden onset per patient after eating at [employer]. CT cervical spine and soft tissue neck with no acute findings. CT cervical spine:  Multilevel degenerative change including degenerative disease and  facet/uncovertebral hypertrophy with varying degrees of neural foraminal stenosis He has no headache/vision changes, decreased ROM or fevers. No radicular red flags in upper extremities .Continue conservative therapy with heating pad/muscle relaxer    Elevated troponin: Troponin flat, no new EKG changes and no complaints of chest pain  Likely secondary to rhabdomyolysis; On tele, continue to monitor    Memory loss: Having intermittent hallucinations and paranoia for months per family; CXR clear and UA looks clean, no other signs of infection. Check urine culture, PCT (2.50 -> 1.77), trend CBC; No headaches/vision changes/neck rigidity/ ? Dementia vs. Underlying mood disorder. Ordered Brain MRI, but has PPM. Delirium precautions/fall precautions. If hallucinations persist despite w/u may benefit from Psych evaluation   Benign Hypertension: Blood pressure decently controlled.  Continue home norvasc at 5mg  daily. Hold lasix, vasotec with acute on chronic kidney injury. PRN labetalol. CTM BP per Protocol. Last BP reading was 135/63   Bladder cancer [facility]): He was found to have a bladder tumor and underwent TURBT on 01/24/17. Final pathology revealed G3Ta. He then underwent induction BCG which he completed on 03/20/17. Second TURBT on 07/18/17 persistent G3Ta TCC and he underwent a second induction course of BCG which he completed on 09/20/17. Bladder biopsies were negative for cancer on 11/21/17. Office cystoscopy was OK on 11/24/22    Dyslipidemia: Hold statin in setting of rhabdomyolysis    History of prostate cancer: LDBT with Iodine-125 for localized adenocarcinoma of the prostate in 2002; Will need outpt F/U   Gastroesophageal reflux disease/GI Prophylaxis: C/w PPI    Hypoalbuminemia: Patient's Albumin trened down and now 2.5. CTM and Trend and repeat CMP in the AM   DVT prophylaxis: enoxaparin (LOVENOX) injection 40 mg Start: 02/11/24 1000 SCDs Start: 02/10/24 0950    Code Status: Full  Code Family Communication: No family present @ beside  Disposition Plan:  Level of care: Telemetry Medical Status is: Inpatient Remains inpatient appropriate because: Needs SNF and futher evaluation of his fever and Right knee   Consultants:  Orthopedic Surgery  Procedures:  As delineated as above   Antimicrobials:  Anti-infectives (From admission, onward)    Start     Dose/Rate Route Frequency Ordered Stop   02/13/24 1800  vancomycin (VANCOCIN) IVPB 1000 mg/200 mL premix        1,000 mg 200 mL/hr over 60 Minutes Intravenous Every 24 hours 02/12/24 1619     02/12/24 2000  ceFEPIme (MAXIPIME) 2 g in sodium chloride  0.9 % 100 mL IVPB        2 g 200 mL/hr over 30 Minutes Intravenous 2 times daily 02/12/24 1714     02/12/24 1715  vancomycin (VANCOREADY) IVPB 1500 mg/300 mL        1,500 mg 150 mL/hr over 120 Minutes Intravenous  Once 02/12/24 1619 02/12/24 1922   02/12/24 1715  ceFEPIme (MAXIPIME) 2 g in sodium chloride  0.9 % 100 mL IVPB  Status:  Discontinued        2 g 200 mL/hr over 30 Minutes Intravenous 2 times daily 02/12/24 1619 02/12/24 1713   02/09/24 2315  cefTRIAXone (ROCEPHIN) 1 g in sodium chloride  0.9 % 100 mL IVPB  1 g 200 mL/hr over 30 Minutes Intravenous  Once 02/09/24 2307 02/10/24 0137   02/09/24 2315  azithromycin (ZITHROMAX) 500 mg in sodium chloride  0.9 % 250 mL IVPB        500 mg 250 mL/hr over 60 Minutes Intravenous  Once 02/09/24 2307 02/10/24 0220       Subjective: Seen and examined at bedside was resting.  Was still complaining about some knee pain.  No nausea or vomiting.  Continues to complain of some neck stiffness as well only when he turns his head a certain way.  Denies any other concerns or complaints at this time.  Objective: Vitals:   02/12/24 1200 02/12/24 1556 02/12/24 1600 02/12/24 1954  BP: 134/63 (!) 157/81  135/63  Pulse:  (!) 114  82  Resp: (!) 21 16 (!) 22 18  Temp: 97.8 F (36.6 C) (!) 101.1 F (38.4 C)  98.8 F (37.1 C)   TempSrc:  Oral  Oral  SpO2: 98% 100%  97%  Weight:      Height:        Intake/Output Summary (Last 24 hours) at 02/12/2024 2103 Last data filed at 02/12/2024 1722 Gross per 24 hour  Intake 1247.5 ml  Output 675 ml  Net 572.5 ml   Filed Weights   02/09/24 2044  Weight: 73.5 kg   Examination: Physical Exam:  Constitutional: Thin elderly African-American male who appears calm and resting Respiratory: Diminished to auscultation bilaterally, no wheezing, rales, rhonchi or crackles. Normal respiratory effort and patient is not tachypenic. No accessory muscle use.  Unlabored breathing Cardiovascular: RRR, no murmurs / rubs / gallops. S1 and S2 auscultated. No extremity edema.   Abdomen: Soft, non-tender, non-distended. Bowel sounds positive.  GU: Deferred. Musculoskeletal: Right is extremely swollen and a little warm to touch Skin: No rashes, lesions, ulcers on limited skin evaluation. No induration; Warm and dry.  Neurologic: CN 2-12 grossly intact with no focal deficits. Romberg sign and cerebellar reflexes not assessed.  Psychiatric: Normal judgment and insight. Alert and oriented x 3. Normal mood and appropriate affect.   Data Reviewed: I have personally reviewed following labs and imaging studies  CBC: Recent Labs  Lab 02/08/24 0813 02/09/24 2110 02/10/24 1229 02/11/24 0335 02/12/24 0714  WBC 13.0* 17.0* 15.5* 15.9* 15.2*  NEUTROABS 9.8* 13.5* 11.9*  --  10.9*  HGB 11.9* 12.6* 12.5* 11.2* 11.1*  HCT 36.2* 39.0 39.1 35.3* 34.7*  MCV 90.7 91.8 92.0 92.2 92.0  PLT 198 246 216 237 256   Basic Metabolic Panel: Recent Labs  Lab 02/08/24 0813 02/09/24 2110 02/10/24 1229 02/11/24 0335 02/12/24 0714  NA 139 136 143 141 139  K 4.5 4.2 3.9 4.4 4.4  CL 103 104 109 110 107  CO2 25 20* 21* 22 21*  GLUCOSE 143* 236* 80 118* 96  BUN 20 27* 25* 23 23  CREATININE 1.51* 1.83* 1.42* 1.39* 1.45*  CALCIUM 9.9 9.3 9.2 8.8* 8.8*  MG  --   --  1.9  --  1.9  PHOS  --   --  2.8  --   3.8   GFR: Estimated Creatinine Clearance: 37.1 mL/min (A) (by C-G formula based on SCr of 1.45 mg/dL (H)). Liver Function Tests: Recent Labs  Lab 02/08/24 0813 02/09/24 2110 02/10/24 1229 02/11/24 0335 02/12/24 0714  AST 22 63* 66* 46* 42*  ALT 10 19 22 23 29   ALKPHOS 54 52 52 43 49  BILITOT 0.8 1.3* 1.0 0.9 0.8  PROT 7.8 7.7 7.4  6.5 6.3*  ALBUMIN 4.2 3.4* 3.2* 2.7* 2.5*   Recent Labs  Lab 02/08/24 0813  LIPASE 33   Recent Labs  Lab 02/10/24 1229  AMMONIA 21   Coagulation Profile: No results for input(s): "INR", "PROTIME" in the last 168 hours. Cardiac Enzymes: Recent Labs  Lab 02/09/24 2110 02/10/24 1229 02/11/24 0335 02/12/24 0714  CKTOTAL 1,652* 985* 351 479*   BNP (last 3 results) No results for input(s): "PROBNP" in the last 8760 hours. HbA1C: No results for input(s): "HGBA1C" in the last 72 hours. CBG: No results for input(s): "GLUCAP" in the last 168 hours. Lipid Profile: No results for input(s): "CHOL", "HDL", "LDLCALC", "TRIG", "CHOLHDL", "LDLDIRECT" in the last 72 hours. Thyroid Function Tests: Recent Labs    02/10/24 1229  TSH 1.577   Anemia Panel: Recent Labs    02/10/24 1229 02/12/24 0714  VITAMINB12 172* 4,454*  FOLATE  --  12.1  FERRITIN  --  641*  TIBC  --  211*  IRON  --  18*  RETICCTPCT  --  0.9   Sepsis Labs: Recent Labs  Lab 02/09/24 2115 02/10/24 0048 02/10/24 1229 02/11/24 0335 02/12/24 0713  PROCALCITON  --   --  2.50 1.77 1.10  LATICACIDVEN 2.1* 1.2  --   --   --    Recent Results (from the past 240 hours)  Resp panel by RT-PCR (RSV, Flu A&B, Covid) Anterior Nasal Swab     Status: None   Collection Time: 02/03/24  8:47 AM   Specimen: Anterior Nasal Swab  Result Value Ref Range Status   SARS Coronavirus 2 by RT PCR NEGATIVE NEGATIVE Final    Comment: (NOTE) SARS-CoV-2 target nucleic acids are NOT DETECTED.  The SARS-CoV-2 RNA is generally detectable in upper respiratory specimens during the acute phase of  infection. The lowest concentration of SARS-CoV-2 viral copies this assay can detect is 138 copies/mL. A negative result does not preclude SARS-Cov-2 infection and should not be used as the sole basis for treatment or other patient management decisions. A negative result may occur with  improper specimen collection/handling, submission of specimen other than nasopharyngeal swab, presence of viral mutation(s) within the areas targeted by this assay, and inadequate number of viral copies(<138 copies/mL). A negative result must be combined with clinical observations, patient history, and epidemiological information. The expected result is Negative.  Fact Sheet for Patients:  BloggerCourse.com  Fact Sheet for Healthcare Providers:  SeriousBroker.it  This test is no t yet approved or cleared by the [place]  FDA and  has been authorized for detection and/or diagnosis of SARS-CoV-2 by FDA under an Emergency Use Authorization (EUA). This EUA will remain  in effect (meaning this test can be used) for the duration of the COVID-19 declaration under Section 564(b)(1) of the Act, 21 U.S.C.section 360bbb-3(b)(1), unless the authorization is terminated  or revoked sooner.       Influenza A by PCR NEGATIVE NEGATIVE Final   Influenza B by PCR NEGATIVE NEGATIVE Final    Comment: (NOTE) The Xpert Xpress SARS-CoV-2/FLU/RSV plus assay is intended as an aid in the diagnosis of influenza from Nasopharyngeal swab specimens and should not be used as a sole basis for treatment. Nasal washings and aspirates are unacceptable for Xpert Xpress SARS-CoV-2/FLU/RSV testing.  Fact Sheet for Patients: BloggerCourse.com  Fact Sheet for Healthcare Providers: SeriousBroker.it  This test is not yet approved or cleared by the [place]  FDA and has been authorized for detection and/or diagnosis of SARS-CoV-2  by FDA under  an Emergency Use Authorization (EUA). This EUA will remain in effect (meaning this test can be used) for the duration of the COVID-19 declaration under Section 564(b)(1) of the Act, 21 U.S.C. section 360bbb-3(b)(1), unless the authorization is terminated or revoked.     Resp Syncytial Virus by PCR NEGATIVE NEGATIVE Final    Comment: (NOTE) Fact Sheet for Patients: BloggerCourse.com  Fact Sheet for Healthcare Providers: SeriousBroker.it  This test is not yet approved or cleared by the [place]  FDA and has been authorized for detection and/or diagnosis of SARS-CoV-2 by FDA under an Emergency Use Authorization (EUA). This EUA will remain in effect (meaning this test can be used) for the duration of the COVID-19 declaration under Section 564(b)(1) of the Act, 21 U.S.C. section 360bbb-3(b)(1), unless the authorization is terminated or revoked.  Performed at [facility], [address] [place], [place] [address]   Blood culture (routine x 2)     Status: None (Preliminary result)   Collection Time: 02/09/24  9:56 PM   Specimen: BLOOD LEFT ARM  Result Value Ref Range Status   Specimen Description BLOOD LEFT ARM  Final   Special Requests   Final    BOTTLES DRAWN AEROBIC AND ANAEROBIC Blood Culture results may not be optimal due to an inadequate volume of blood received in culture bottles   Culture   Final    NO GROWTH 3 DAYS Performed at [facility], [address], [place], [place] [address]    Report Status PENDING  Incomplete  Blood culture (routine x 2)     Status: None (Preliminary result)   Collection Time: 02/09/24  9:56 PM   Specimen: BLOOD RIGHT ARM  Result Value Ref Range Status   Specimen Description BLOOD RIGHT ARM  Final   Special Requests   Final    BOTTLES DRAWN AEROBIC ONLY Blood Culture adequate volume   Culture   Final    NO GROWTH 3 DAYS Performed at [facility]  Lab, [address], [place], [place] [address]    Report Status PENDING  Incomplete  Resp panel by RT-PCR (RSV, Flu A&B, Covid) Anterior Nasal Swab     Status: None   Collection Time: 02/09/24 11:07 PM   Specimen: Anterior Nasal Swab  Result Value Ref Range Status   SARS Coronavirus 2 by RT PCR NEGATIVE NEGATIVE Final   Influenza A by PCR NEGATIVE NEGATIVE Final   Influenza B by PCR NEGATIVE NEGATIVE Final    Comment: (NOTE) The Xpert Xpress SARS-CoV-2/FLU/RSV plus assay is intended as an aid in the diagnosis of influenza from Nasopharyngeal swab specimens and should not be used as a sole basis for treatment. Nasal washings and aspirates are unacceptable for Xpert Xpress SARS-CoV-2/FLU/RSV testing.  Fact Sheet for Patients: BloggerCourse.com  Fact Sheet for Healthcare Providers: SeriousBroker.it  This test is not yet approved or cleared by the [place]  FDA and has been authorized for detection and/or diagnosis of SARS-CoV-2 by FDA under an Emergency Use Authorization (EUA). This EUA will remain in effect (meaning this test can be used) for the duration of the COVID-19 declaration under Section 564(b)(1) of the Act, 21 U.S.C. section 360bbb-3(b)(1), unless the authorization is terminated or revoked.     Resp Syncytial Virus by PCR NEGATIVE NEGATIVE Final    Comment: (NOTE) Fact Sheet for Patients: BloggerCourse.com  Fact Sheet for Healthcare Providers: SeriousBroker.it  This test is not yet approved or cleared by the [place]  FDA and has been  authorized for detection and/or diagnosis of SARS-CoV-2 by FDA under an Emergency Use Authorization (EUA). This EUA will remain in effect (meaning this test can be used) for the duration of the COVID-19 declaration under Section 564(b)(1) of the Act, 21 U.S.C. section 360bbb-3(b)(1), unless the authorization is terminated  or revoked.  Performed at [facility], [address], [place], [place] [address]   Urine Culture (for pregnant, neutropenic or urologic patients or patients with an indwelling urinary catheter)     Status: None   Collection Time: 02/10/24  9:51 AM   Specimen: Urine, Clean Catch  Result Value Ref Range Status   Specimen Description URINE, CLEAN CATCH  Final   Special Requests NONE  Final   Culture   Final    NO GROWTH Performed at [facility], [address], [place], [place] [address]    Report Status 02/12/2024 FINAL  Final  Respiratory (~20 pathogens) panel by PCR     Status: None   Collection Time: 02/10/24  3:09 PM   Specimen: Nasopharyngeal Swab; Respiratory  Result Value Ref Range Status   Adenovirus NOT DETECTED NOT DETECTED Final   Coronavirus 229E NOT DETECTED NOT DETECTED Final    Comment: (NOTE) The Coronavirus on the Respiratory Panel, DOES NOT test for the novel  Coronavirus (2019 nCoV)    Coronavirus HKU1 NOT DETECTED NOT DETECTED Final   Coronavirus NL63 NOT DETECTED NOT DETECTED Final   Coronavirus OC43 NOT DETECTED NOT DETECTED Final   Metapneumovirus NOT DETECTED NOT DETECTED Final   Rhinovirus / Enterovirus NOT DETECTED NOT DETECTED Final   Influenza A NOT DETECTED NOT DETECTED Final   Influenza B NOT DETECTED NOT DETECTED Final   Parainfluenza Virus 1 NOT DETECTED NOT DETECTED Final   Parainfluenza Virus 2 NOT DETECTED NOT DETECTED Final   Parainfluenza Virus 3 NOT DETECTED NOT DETECTED Final   Parainfluenza Virus 4 NOT DETECTED NOT DETECTED Final   Respiratory Syncytial Virus NOT DETECTED NOT DETECTED Final   Bordetella pertussis NOT DETECTED NOT DETECTED Final   Bordetella Parapertussis NOT DETECTED NOT DETECTED Final   Chlamydophila pneumoniae NOT DETECTED NOT DETECTED Final   Mycoplasma pneumoniae NOT DETECTED NOT DETECTED Final    Comment: Performed at [facility], [address] [place], [place] [address]  Body fluid culture  w Gram Stain     Status: None (Preliminary result)   Collection Time: 02/12/24  6:56 PM   Specimen: Synovium; Synovial Fluid  Result Value Ref Range Status   Specimen Description SYNOVIAL  Final   Special Requests RIGHT KNEE  Final   Gram Stain   Final    ABUNDANT WBC PRESENT, PREDOMINANTLY PMN NO ORGANISMS SEEN Performed at [facility], [address] [place], [place] [address]    Culture PENDING  Incomplete   Report Status PENDING  Incomplete    Radiology Studies: MR BRAIN WO CONTRAST Result Date: 02/12/2024 CLINICAL DATA:  Psychosis EXAM: MRI HEAD WITHOUT CONTRAST TECHNIQUE: Multiplanar, multiecho pulse sequences of the brain and surrounding structures were obtained without intravenous contrast. COMPARISON:  CT head Feb 08, 2024. FINDINGS: Brain: No acute infarction, hemorrhage, hydrocephalus, extra-axial collection or mass lesion. Cerebral atrophy. Mild for age T2/FLAIR hyperintensities the white matter, nonspecific but compatible with chronic microvascular ischemic disease. Vascular: Major arterial flow voids are maintained at the skull base. Skull and upper cervical spine: Normal marrow signal. Sinuses/Orbits: Negative. Other: Trace left mastoid effusion. IMPRESSION: No evidence of acute intracranial abnormality. Electronically  Signed   By: Stevenson Elbe M.D.   On: 02/12/2024 19:21   CT KNEE RIGHT W CONTRAST Result Date: 02/12/2024 CLINICAL DATA:  Knee pain. EXAM: CT OF THE RIGHT KNEE WITH CONTRAST TECHNIQUE: Multidetector CT imaging was performed following the standard protocol during bolus administration of intravenous contrast. RADIATION DOSE REDUCTION: This exam was performed according to the departmental dose-optimization program which includes automated exposure control, adjustment of the mA and/or kV according to patient size and/or use of iterative reconstruction technique. CONTRAST:  75mL OMNIPAQUE  IOHEXOL  350 MG/ML SOLN COMPARISON:  Right knee radiographs dated 02/09/2024.  FINDINGS: Bones/Joint/Cartilage No acute fracture or dislocation. Severe lateral patellofemoral joint space narrowing resulting in bone-on-bone contact, subchondral cystic change, and osteophytosis. Moderate lateral femorotibial compartment joint space narrowing. Amorphous mineralization within the medial and lateral femorotibial compartments, may reflect chondrocalcinosis. Moderate size knee joint effusion. Ligaments Ligaments are suboptimally evaluated by CT. Soft tissue and Muscles Partially visualized fatty atrophy of the medial gastrocnemius muscle. No intramuscular fluid collection or hematoma. IMPRESSION: 1. No acute osseous abnormality. 2. Moderate to severe tricompartmental degenerative changes of the knee with chondrocalcinosis, as above. 3. Moderate knee joint effusion. Electronically Signed   By: Mannie Seek M.D.   On: 02/12/2024 08:31   Scheduled Meds:  amLODipine  5 mg Oral Daily   cyanocobalamin  1,000 mcg Intramuscular Daily   docusate sodium   100 mg Oral Q12H   enoxaparin (LOVENOX) injection  40 mg Subcutaneous Q24H   lidocaine  (PF)  5 mL Intradermal Once   loratadine   10 mg Oral Daily   pantoprazole  40 mg Oral Daily   Continuous Infusions:  ceFEPime (MAXIPIME) IV     lactated ringers      [START ON 02/13/2024] vancomycin      LOS: 2 days   Aura Leeds, DO [facility] Available via Epic secure chat 7am-7pm After these hours, please refer to coverage provider listed on [email or web address] 02/12/2024, 9:03 PM

## 2024-02-12 NOTE — Progress Notes (Signed)
 Due to this pt having a pacemaker nurse monitoring will be required the nurse has been notified the pt has an 11 am appointment to have MRI.

## 2024-02-12 NOTE — CV Procedure (Signed)
  Device system confirmed to be MRI conditional, with implant date > 6 weeks ago, and no evidence of abandoned or epicardial leads in review of most recent CXR  Device last cleared by EP Provider: Valeri Gate 02/12/24  Clearance is good through for 1 year as long as parameters remain stable at time of check. If pt undergoes a cardiac device procedure during that time, they should be re-cleared.   Tachy-therapies to be programmed off if applicable with device back to pre-MRI settings after completion of exam.  [employer] - Programming recommendation received through [employer] App/Tablet  Arlys Lamer, RT  02/12/2024 2:46 PM

## 2024-02-13 DIAGNOSIS — M6282 Rhabdomyolysis: Secondary | ICD-10-CM | POA: Diagnosis not present

## 2024-02-13 DIAGNOSIS — M109 Gout, unspecified: Secondary | ICD-10-CM

## 2024-02-13 DIAGNOSIS — R6510 Systemic inflammatory response syndrome (SIRS) of non-infectious origin without acute organ dysfunction: Secondary | ICD-10-CM | POA: Diagnosis not present

## 2024-02-13 DIAGNOSIS — N400 Enlarged prostate without lower urinary tract symptoms: Secondary | ICD-10-CM

## 2024-02-13 DIAGNOSIS — Z8546 Personal history of malignant neoplasm of prostate: Secondary | ICD-10-CM

## 2024-02-13 DIAGNOSIS — N179 Acute kidney failure, unspecified: Secondary | ICD-10-CM | POA: Diagnosis not present

## 2024-02-13 LAB — CBC WITH DIFFERENTIAL/PLATELET
Abs Immature Granulocytes: 0.11 10*3/uL — ABNORMAL HIGH (ref 0.00–0.07)
Basophils Absolute: 0 10*3/uL (ref 0.0–0.1)
Basophils Relative: 0 %
Eosinophils Absolute: 0 10*3/uL (ref 0.0–0.5)
Eosinophils Relative: 0 %
HCT: 31.8 % — ABNORMAL LOW (ref 39.0–52.0)
Hemoglobin: 10.3 g/dL — ABNORMAL LOW (ref 13.0–17.0)
Immature Granulocytes: 1 %
Lymphocytes Relative: 9 %
Lymphs Abs: 1.3 10*3/uL (ref 0.7–4.0)
MCH: 29.4 pg (ref 26.0–34.0)
MCHC: 32.4 g/dL (ref 30.0–36.0)
MCV: 90.9 fL (ref 80.0–100.0)
Monocytes Absolute: 2.2 10*3/uL — ABNORMAL HIGH (ref 0.1–1.0)
Monocytes Relative: 15 %
Neutro Abs: 10.7 10*3/uL — ABNORMAL HIGH (ref 1.7–7.7)
Neutrophils Relative %: 75 %
Platelets: 255 10*3/uL (ref 150–400)
RBC: 3.5 MIL/uL — ABNORMAL LOW (ref 4.22–5.81)
RDW: 12.8 % (ref 11.5–15.5)
WBC: 14.4 10*3/uL — ABNORMAL HIGH (ref 4.0–10.5)
nRBC: 0 % (ref 0.0–0.2)

## 2024-02-13 LAB — COMPREHENSIVE METABOLIC PANEL WITH GFR
ALT: 26 U/L (ref 0–44)
AST: 35 U/L (ref 15–41)
Albumin: 2.2 g/dL — ABNORMAL LOW (ref 3.5–5.0)
Alkaline Phosphatase: 47 U/L (ref 38–126)
Anion gap: 9 (ref 5–15)
BUN: 22 mg/dL (ref 8–23)
CO2: 23 mmol/L (ref 22–32)
Calcium: 8.5 mg/dL — ABNORMAL LOW (ref 8.9–10.3)
Chloride: 107 mmol/L (ref 98–111)
Creatinine, Ser: 1.43 mg/dL — ABNORMAL HIGH (ref 0.61–1.24)
GFR, Estimated: 47 mL/min — ABNORMAL LOW (ref >60)
Glucose, Bld: 108 mg/dL — ABNORMAL HIGH (ref 70–99)
Potassium: 4.4 mmol/L (ref 3.5–5.1)
Sodium: 139 mmol/L (ref 135–145)
Total Bilirubin: 0.9 mg/dL (ref 0.0–1.2)
Total Protein: 6 g/dL — ABNORMAL LOW (ref 6.5–8.1)

## 2024-02-13 LAB — LACTIC ACID, PLASMA: Lactic Acid, Venous: 1.1 mmol/L (ref 0.5–1.9)

## 2024-02-13 LAB — CK: Total CK: 209 U/L (ref 49–397)

## 2024-02-13 LAB — PROCALCITONIN: Procalcitonin: 0.73 ng/mL

## 2024-02-13 LAB — MAGNESIUM: Magnesium: 1.9 mg/dL (ref 1.7–2.4)

## 2024-02-13 LAB — PHOSPHORUS: Phosphorus: 3.5 mg/dL (ref 2.5–4.6)

## 2024-02-13 MED ORDER — LIDOCAINE 5 % EX PTCH
1.0000 | MEDICATED_PATCH | CUTANEOUS | Status: DC
  Administered 2024-02-13 – 2024-02-15 (×3): 1 via TRANSDERMAL
  Filled 2024-02-13 (×3): qty 1

## 2024-02-13 MED ORDER — COLCHICINE 0.6 MG PO TABS
0.6000 mg | ORAL_TABLET | Freq: Once | ORAL | Status: AC
  Administered 2024-02-14: 0.6 mg via ORAL
  Filled 2024-02-13: qty 1

## 2024-02-13 MED ORDER — PREDNISONE 50 MG PO TABS
60.0000 mg | ORAL_TABLET | Freq: Every day | ORAL | Status: DC
  Administered 2024-02-14 – 2024-02-16 (×3): 60 mg via ORAL
  Filled 2024-02-13 (×3): qty 1

## 2024-02-13 MED ORDER — COLCHICINE 0.6 MG PO TABS
0.6000 mg | ORAL_TABLET | Freq: Every day | ORAL | Status: DC
  Administered 2024-02-14 – 2024-02-16 (×3): 0.6 mg via ORAL
  Filled 2024-02-13 (×3): qty 1

## 2024-02-13 MED ORDER — COLCHICINE 0.6 MG PO TABS: 0.6000 mg | ORAL_TABLET | Freq: Two times a day (BID) | ORAL | Status: DC

## 2024-02-13 MED ORDER — COLCHICINE 0.6 MG PO TABS
0.6000 mg | ORAL_TABLET | ORAL | Status: AC
  Administered 2024-02-13: 0.6 mg via ORAL
  Filled 2024-02-13: qty 1

## 2024-02-13 NOTE — Plan of Care (Signed)

## 2024-02-13 NOTE — Progress Notes (Addendum)
 PROGRESS NOTE    Richard LUNEY  Osborne:[id number] DOB: 03-17-1934 DOA: 02/09/2024 PCP: [name], MD   Brief Narrative:  Richard Osborne is a 89 y.o. male with medical history significant of HTN, HLD, hx of prostate cancer, PAF, PPM, CKD stage 3a who presented to ED after being found down at home for 6-7 hours. He has history of atrial fib, but on no blood thinners. Denies hitting his head.    His sister states he has been hallucinating as well and she states he has been seeing things and complaining of someone watching him. She states this has been going on for months. She states he has had a hard time walking for a while with hip pain and joint pain. She doesn't think he has been eating well or taking his medication as he should.  **Interim History:  Right knee very swollen and painful obtaining and showed moderate effusion. Spiked a Temperature of 101.1 02/12/24 so will repeat blood Cx and start Abx. 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 PT/OT recommending SNF  Assessment and Plan:  Rhabdomyolysis: 89 year old who had a fall at home due to weakness and was on ground x 6-7 hours found to be in rhabdomyolysis with elevated CK and acute on chronic kidney injury; Continue IVF as below and trend CK; CK improved to 351 but now worsened to 479 yesterday w/ repeat pending; mag/phos wnl -UA with moderate hgb/ 0-5 RBC. PT/OT eval recommending SNF    AKI superimposed on CKD3a [facility]): Likely secondary to rhabdomyolysis; BUN/Cr Trending down from 27/1.83 -> 22/1.43 Getting IVF @ 75 mL/hr x1 Day. Avoid Nephrotoxic Medications, Contrast Dyes, Hypotension and Dehydration to Ensure Adequate Renal Perfusion and will need to Renally Adjust Meds. CTM and Trend Renal Function carefully and repeat CMP in the AM    SIRS with concern for Sepsis; unclear Etiology but ? Right Knee; r/o other Causes -Presented with tachycardia and leukocytosis. Tachycardia resolved with  IVF and WBC improving but patient spiked a Tmax of 101.1 02/12/24.  -CXR clear, UA not impressive; He has neck pain but no fever/chills, vision changes/headaches or neck rigidity; Blood Cx and urine cultures collected; Repeat Blood Cx on 02/12/24 given fever -WBC remains elevated but trending down and went from 15.5 -> 15.9 -> 15.2 -> 14.4 -Received Ceftriaxone and Azithromycin in ED but no complaints of cough/shortness of breath and CXR clear so Abx were stopped. Resumed Abx with IV Vanc and Cefepime whe became febrile but low threshold to D/C Abx and Monitor off of Abc -RVP/flu/Covid/RSV NEGATIVE;  PCT trending down and went from 2.50 -> 1.77 -> 1.10 -> 0.77 -Right Knee very swollen and painful; X-Ray showed  degenerative changes with small joint effusion. No acute abnormality noted.; CT scan done and showed  No acute osseous abnormality but did show Moderate to severe tricompartmental degenerative changes of the knee with chondrocalcinosis, as above and a Moderate knee joint effusion. -Orthopedic Consulted for drainage and patient underwent knee aspiration that was sent for culture, crystals and cell count as well as Gram stain. Gram Stain showed abundant WBC and Synovial Fluid studies WBC 5350 w/ cloudy appearance and 84% neutrophils and 16% monocytes/macrophages.  There was also noted to have extracellular and intracellular monosodium urate crystals, intracellular calcium pyrophosphate crystals and extracellular calcium pyrophosphate crystals indicative of Gouty inflammatory arthropathy. Will start Prednisone  60 mg Daily and Colchicine for now and Check Uric Acid Level in the AM -Brain MRI done and  showed no evidence of acute intracranial abnormality. History of hallucinations/paranoia for months that is intermittent.  Now that he is having Neck pain and spiked a temp, will consider LP if fails to improve after treatment of knee  Generalized Weakness: Checked TSH and was 1.577. B12 172 so start IM  replacement.  Family reports weakness present for months. Lumbar xray showed Moderate to severe diffuse degenerative disc disease. No acute fracture or listhesis. Hip X-ray showed Severe left and mild-to-moderate right hip degenerative arthritis. C/w LR @ 75 mL/hr x 1 day. Will consider MRI if continues to have pain. IVF. PT/OT to eval and recommending SNF    Neck Pain: Sudden onset per patient after eating at [employer]. CT cervical spine and soft tissue neck with no acute findings.CT cervical spine: Multilevel degenerative change including degenerative disease and facet/uncovertebral hypertrophy with varying degrees of neural foraminal stenosis. He has no headache/vision changes, decreased ROM ; Had fever only yesterday and ? Related to Knee. No radicular red flags in upper extremities . C/w conservative therapy with heating pad/muscle relaxer; Add Lidocaine  Patch to the Neck    Elevated Troponin: Troponin flat, no new EKG changes and no complaints of chest pain  Likely secondary to rhabdomyolysis; On Telemetry. CTM   Memory Loss w/ intermittent Hallucinations and Paranoia: happening for months per family; CXR clear and UA looks clean, Urine Cx NEG, PCT trend as above. No headaches/vision changes/neck rigidity/ ? Dementia vs. Underlying mood disorder. Brain MRI showed No evidence of acute intracranial abnormality. C/w Delirium precautions/fall precautions. If hallucinations persist despite w/u may benefit from Psych evaluation but has not really been hallucinating here   Benign Hypertension: Blood pressure decently controlled.  Continue home norvasc at 5mg  daily. Hold lasix, vasotec with acute on chronic kidney injury. PRN labetalol. CTM BP per Protocol. Last BP reading was 153/78   Bladder Cancer [facility]): He was found to have a bladder tumor and underwent TURBT on 01/24/17. Final pathology revealed G3Ta. He then underwent induction BCG which he completed on 03/20/17. Second TURBT on 07/18/17 persistent  G3Ta TCC and he underwent a second induction course of BCG which he completed on 09/20/17. Bladder biopsies were negative for cancer on 11/21/17. Office cystoscopy was OK on 11/24/22    Dyslipidemia: Hold statin in setting of Rhabdomyolysis    History of Prostate Cancer: LDBT with Iodine-125 for localized adenocarcinoma of the prostate in 2002; Will need outpt F/U   GERD/GI Prophylaxis: C/w PPI w/ Pantoprazole 40 mg po daily   Hypoalbuminemia: Patient's Albumin trened down and now 2.2. CTM and Trend and repeat CMP in the AM   DVT prophylaxis: enoxaparin (LOVENOX) injection 40 mg Start: 02/11/24 1000 SCDs Start: 02/10/24 0950    Code Status: Full Code Family Communication: D/w Family present @ bedside  Disposition Plan:  Level of care: Telemetry Medical Status is: Inpatient Remains inpatient appropriate because: Needs SNF when Stable for D/C but will monitor for S/Sx Infection and Treat Gout   Consultants:  Orthopedic Surgery  Procedures:  As delineated as Above   Antimicrobials:  Anti-infectives (From admission, onward)    Start     Dose/Rate Route Frequency Ordered Stop   02/13/24 1800  vancomycin (VANCOCIN) IVPB 1000 mg/200 mL premix        1,000 mg 200 mL/hr over 60 Minutes Intravenous Every 24 hours 02/12/24 1619     02/12/24 2000  ceFEPIme (MAXIPIME) 2 g in sodium chloride  0.9 % 100 mL IVPB  2 g 200 mL/hr over 30 Minutes Intravenous 2 times daily 02/12/24 1714     02/12/24 1715  vancomycin (VANCOREADY) IVPB 1500 mg/300 mL        1,500 mg 150 mL/hr over 120 Minutes Intravenous  Once 02/12/24 1619 02/13/24 0717   02/12/24 1715  ceFEPIme (MAXIPIME) 2 g in sodium chloride  0.9 % 100 mL IVPB  Status:  Discontinued        2 g 200 mL/hr over 30 Minutes Intravenous 2 times daily 02/12/24 1619 02/12/24 1713   02/09/24 2315  cefTRIAXone (ROCEPHIN) 1 g in sodium chloride  0.9 % 100 mL IVPB        1 g 200 mL/hr over 30 Minutes Intravenous  Once 02/09/24 2307 02/10/24 0137    02/09/24 2315  azithromycin (ZITHROMAX) 500 mg in sodium chloride  0.9 % 250 mL IVPB        500 mg 250 mL/hr over 60 Minutes Intravenous  Once 02/09/24 2307 02/10/24 0220       Subjective: Seen and examined at bedside and was sitting in the chair and continues to have some right knee pain and stiffness.  No nausea or vomiting.  States that his neck pain is not as bad.  Convinced that he got an MRI on his knee yesterday which was not true.  Denied any chest pain or shortness of breath.  Feels okay otherwise.  Nursing states that his heart rate significantly elevates whenever he ambulates and he is very deconditioned.  Objective: Vitals:   02/13/24 0433 02/13/24 0813 02/13/24 1145 02/13/24 1550  BP: (!) 152/77 (!) 155/90 (!) 150/85 (!) 153/78  Pulse: 85 86  (!) 108  Resp: 18 17  18   Temp: 99 F (37.2 C)   98.2 F (36.8 C)  TempSrc: Oral     SpO2: 97% 97%  98%  Weight:      Height:        Intake/Output Summary (Last 24 hours) at 02/13/2024 1731 Last data filed at 02/13/2024 1200 Gross per 24 hour  Intake 200 ml  Output 450 ml  Net -250 ml   Filed Weights   02/09/24 2044  Weight: 73.5 kg   Examination: Physical Exam:  Constitutional: Elderly African-American male who is sitting in the chair appears little uncomfortable Respiratory: Diminished to auscultation bilaterally, no wheezing, rales, rhonchi or crackles. Normal respiratory effort and patient is not tachypenic. No accessory muscle use.  Unlabored breathing Cardiovascular: RRR, no murmurs / rubs / gallops. S1 and S2 auscultated. No extremity edema.   Abdomen: Soft, non-tender, non-distended. Bowel sounds positive.  GU: Deferred. Musculoskeletal: Right knee is not as swollen but still remains a little warm to touch. Skin: No rashes, lesions, ulcers limited skin evaluation.. No induration; Warm and dry.  Neurologic: CN 2-12 grossly intact with no focal deficits. Romberg sign cerebellar reflexes not assessed.  Psychiatric:  Awake and alert but a little confused  Data Reviewed: I have personally reviewed following labs and imaging studies  CBC: Recent Labs  Lab 02/08/24 0813 02/09/24 2110 02/10/24 1229 02/11/24 0335 02/12/24 0714 02/13/24 0623  WBC 13.0* 17.0* 15.5* 15.9* 15.2* 14.4*  NEUTROABS 9.8* 13.5* 11.9*  --  10.9* 10.7*  HGB 11.9* 12.6* 12.5* 11.2* 11.1* 10.3*  HCT 36.2* 39.0 39.1 35.3* 34.7* 31.8*  MCV 90.7 91.8 92.0 92.2 92.0 90.9  PLT 198 246 216 237 256 255   Basic Metabolic Panel: Recent Labs  Lab 02/09/24 2110 02/10/24 1229 02/11/24 0335 02/12/24 0714 02/13/24 0623  NA 136  143 141 139 139  K 4.2 3.9 4.4 4.4 4.4  CL 104 109 110 107 107  CO2 20* 21* 22 21* 23  GLUCOSE 236* 80 118* 96 108*  BUN 27* 25* 23 23 22   CREATININE 1.83* 1.42* 1.39* 1.45* 1.43*  CALCIUM 9.3 9.2 8.8* 8.8* 8.5*  MG  --  1.9  --  1.9 1.9  PHOS  --  2.8  --  3.8 3.5   GFR: Estimated Creatinine Clearance: 37.6 mL/min (A) (by C-G formula based on SCr of 1.43 mg/dL (H)). Liver Function Tests: Recent Labs  Lab 02/09/24 2110 02/10/24 1229 02/11/24 0335 02/12/24 0714 02/13/24 0623  AST 63* 66* 46* 42* 35  ALT 19 22 23 29 26   ALKPHOS 52 52 43 49 47  BILITOT 1.3* 1.0 0.9 0.8 0.9  PROT 7.7 7.4 6.5 6.3* 6.0*  ALBUMIN 3.4* 3.2* 2.7* 2.5* 2.2*   Recent Labs  Lab 02/08/24 0813  LIPASE 33   Recent Labs  Lab 02/10/24 1229  AMMONIA 21   Coagulation Profile: No results for input(s): "INR", "PROTIME" in the last 168 hours. Cardiac Enzymes: Recent Labs  Lab 02/09/24 2110 02/10/24 1229 02/11/24 0335 02/12/24 0714  CKTOTAL 1,652* 985* 351 479*   BNP (last 3 results) No results for input(s): "PROBNP" in the last 8760 hours. HbA1C: No results for input(s): "HGBA1C" in the last 72 hours. CBG: No results for input(s): "GLUCAP" in the last 168 hours. Lipid Profile: No results for input(s): "CHOL", "HDL", "LDLCALC", "TRIG", "CHOLHDL", "LDLDIRECT" in the last 72 hours. Thyroid Function Tests: No  results for input(s): "TSH", "T4TOTAL", "FREET4", "T3FREE", "THYROIDAB" in the last 72 hours. Anemia Panel: Recent Labs    02/12/24 0714  VITAMINB12 4,454*  FOLATE 12.1  FERRITIN 641*  TIBC 211*  IRON 18*  RETICCTPCT 0.9   Sepsis Labs: Recent Labs  Lab 02/09/24 2115 02/10/24 0048 02/10/24 1229 02/11/24 0335 02/12/24 0713 02/13/24 0623  PROCALCITON  --   --  2.50 1.77 1.10 0.73  LATICACIDVEN 2.1* 1.2  --   --   --  1.1   Recent Results (from the past 240 hours)  Blood culture (routine x 2)     Status: None (Preliminary result)   Collection Time: 02/09/24  9:56 PM   Specimen: BLOOD LEFT ARM  Result Value Ref Range Status   Specimen Description BLOOD LEFT ARM  Final   Special Requests   Final    BOTTLES DRAWN AEROBIC AND ANAEROBIC Blood Culture results may not be optimal due to an inadequate volume of blood received in culture bottles   Culture   Final    NO GROWTH 4 DAYS Performed at [facility], [address], [place], [place] [address]    Report Status PENDING  Incomplete  Blood culture (routine x 2)     Status: None (Preliminary result)   Collection Time: 02/09/24  9:56 PM   Specimen: BLOOD RIGHT ARM  Result Value Ref Range Status   Specimen Description BLOOD RIGHT ARM  Final   Special Requests   Final    BOTTLES DRAWN AEROBIC ONLY Blood Culture adequate volume   Culture   Final    NO GROWTH 4 DAYS Performed at [facility], [address], [place], [place] [address]    Report Status PENDING  Incomplete  Resp panel by RT-PCR (RSV, Flu A&B, Covid) Anterior Nasal Swab     Status: None   Collection Time: 02/09/24 11:07 PM   Specimen: Anterior Nasal  Swab  Result Value Ref Range Status   SARS Coronavirus 2 by RT PCR NEGATIVE NEGATIVE Final   Influenza A by PCR NEGATIVE NEGATIVE Final   Influenza B by PCR NEGATIVE NEGATIVE Final    Comment: (NOTE) The Xpert Xpress SARS-CoV-2/FLU/RSV plus assay is intended as an aid in the diagnosis of influenza from  Nasopharyngeal swab specimens and should not be used as a sole basis for treatment. Nasal washings and aspirates are unacceptable for Xpert Xpress SARS-CoV-2/FLU/RSV testing.  Fact Sheet for Patients: BloggerCourse.com  Fact Sheet for Healthcare Providers: SeriousBroker.it  This test is not yet approved or cleared by the [place]  FDA and has been authorized for detection and/or diagnosis of SARS-CoV-2 by FDA under an Emergency Use Authorization (EUA). This EUA will remain in effect (meaning this test can be used) for the duration of the COVID-19 declaration under Section 564(b)(1) of the Act, 21 U.S.C. section 360bbb-3(b)(1), unless the authorization is terminated or revoked.     Resp Syncytial Virus by PCR NEGATIVE NEGATIVE Final    Comment: (NOTE) Fact Sheet for Patients: BloggerCourse.com  Fact Sheet for Healthcare Providers: SeriousBroker.it  This test is not yet approved or cleared by the [place]  FDA and has been authorized for detection and/or diagnosis of SARS-CoV-2 by FDA under an Emergency Use Authorization (EUA). This EUA will remain in effect (meaning this test can be used) for the duration of the COVID-19 declaration under Section 564(b)(1) of the Act, 21 U.S.C. section 360bbb-3(b)(1), unless the authorization is terminated or revoked.  Performed at [facility], [address] [place], [place] [address]   Urine Culture (for pregnant, neutropenic or urologic patients or patients with an indwelling urinary catheter)     Status: None   Collection Time: 02/10/24  9:51 AM   Specimen: Urine, Clean Catch  Result Value Ref Range Status   Specimen Description URINE, CLEAN CATCH  Final   Special Requests NONE  Final   Culture   Final    NO GROWTH Performed at [facility], [address] [place], [place] [address]    Report Status 02/12/2024  FINAL  Final  Respiratory (~20 pathogens) panel by PCR     Status: None   Collection Time: 02/10/24  3:09 PM   Specimen: Nasopharyngeal Swab; Respiratory  Result Value Ref Range Status   Adenovirus NOT DETECTED NOT DETECTED Final   Coronavirus 229E NOT DETECTED NOT DETECTED Final    Comment: (NOTE) The Coronavirus on the Respiratory Panel, DOES NOT test for the novel  Coronavirus (2019 nCoV)    Coronavirus HKU1 NOT DETECTED NOT DETECTED Final   Coronavirus NL63 NOT DETECTED NOT DETECTED Final   Coronavirus OC43 NOT DETECTED NOT DETECTED Final   Metapneumovirus NOT DETECTED NOT DETECTED Final   Rhinovirus / Enterovirus NOT DETECTED NOT DETECTED Final   Influenza A NOT DETECTED NOT DETECTED Final   Influenza B NOT DETECTED NOT DETECTED Final   Parainfluenza Virus 1 NOT DETECTED NOT DETECTED Final   Parainfluenza Virus 2 NOT DETECTED NOT DETECTED Final   Parainfluenza Virus 3 NOT DETECTED NOT DETECTED Final   Parainfluenza Virus 4 NOT DETECTED NOT DETECTED Final   Respiratory Syncytial Virus NOT DETECTED NOT DETECTED Final   Bordetella pertussis NOT DETECTED NOT DETECTED Final   Bordetella Parapertussis NOT DETECTED NOT DETECTED Final   Chlamydophila pneumoniae NOT DETECTED NOT DETECTED Final   Mycoplasma pneumoniae NOT DETECTED NOT DETECTED Final    Comment: Performed at [facility] Lab,  [address] [place], [place] [address]  Culture, blood (Routine X 2) w Reflex to ID Panel     Status: None (Preliminary result)   Collection Time: 02/12/24  6:39 PM   Specimen: BLOOD LEFT HAND  Result Value Ref Range Status   Specimen Description BLOOD LEFT HAND  Final   Special Requests   Final    BOTTLES DRAWN AEROBIC AND ANAEROBIC Blood Culture adequate volume   Culture   Final    NO GROWTH < 12 HOURS Performed at [facility], [address] [place], [place] [address]    Report Status PENDING  Incomplete  Culture, blood (Routine X 2) w Reflex to ID Panel     Status: None  (Preliminary result)   Collection Time: 02/12/24  6:42 PM   Specimen: BLOOD RIGHT HAND  Result Value Ref Range Status   Specimen Description BLOOD RIGHT HAND  Final   Special Requests   Final    BOTTLES DRAWN AEROBIC AND ANAEROBIC Blood Culture adequate volume   Culture   Final    NO GROWTH < 12 HOURS Performed at [facility], [address], [place], [place] [address]    Report Status PENDING  Incomplete  Body fluid culture w Gram Stain     Status: None (Preliminary result)   Collection Time: 02/12/24  6:56 PM   Specimen: Synovium; Synovial Fluid  Result Value Ref Range Status   Specimen Description SYNOVIAL  Final   Special Requests RIGHT KNEE  Final   Gram Stain   Final    ABUNDANT WBC PRESENT, PREDOMINANTLY PMN NO ORGANISMS SEEN    Culture   Final    NO GROWTH < 24 HOURS Performed at [facility], [address], [place], [place] [address]    Report Status PENDING  Incomplete    Radiology Studies: MR BRAIN WO CONTRAST Result Date: 02/12/2024 CLINICAL DATA:  Psychosis EXAM: MRI HEAD WITHOUT CONTRAST TECHNIQUE: Multiplanar, multiecho pulse sequences of the brain and surrounding structures were obtained without intravenous contrast. COMPARISON:  CT head Feb 08, 2024. FINDINGS: Brain: No acute infarction, hemorrhage, hydrocephalus, extra-axial collection or mass lesion. Cerebral atrophy. Mild for age T2/FLAIR hyperintensities the white matter, nonspecific but compatible with chronic microvascular ischemic disease. Vascular: Major arterial flow voids are maintained at the skull base. Skull and upper cervical spine: Normal marrow signal. Sinuses/Orbits: Negative. Other: Trace left mastoid effusion. IMPRESSION: No evidence of acute intracranial abnormality. Electronically Signed   By: Stevenson Elbe M.D.   On: 02/12/2024 19:21   Scheduled Meds:  amLODipine  5 mg Oral Daily   [START ON 02/14/2024] colchicine  0.6 mg Oral Daily   colchicine  0.6 mg Oral Once    cyanocobalamin  1,000 mcg Intramuscular Daily   docusate sodium   100 mg Oral Q12H   enoxaparin (LOVENOX) injection  40 mg Subcutaneous Q24H   lidocaine   1 patch Transdermal Q24H   lidocaine  (PF)  5 mL Intradermal Once   loratadine   10 mg Oral Daily   pantoprazole  40 mg Oral Daily   [START ON 02/14/2024] predniSONE   60 mg Oral Q breakfast   Continuous Infusions:  ceFEPime (MAXIPIME) IV 2 g (02/13/24 0914)   lactated ringers      vancomycin 1,000 mg (02/13/24 1727)    LOS: 3 days   Aura Leeds, DO [facility] Available via Epic secure chat 7am-7pm After these hours, please refer to coverage provider listed on [email or web address] 02/13/2024, 5:31 PM

## 2024-02-13 NOTE — Progress Notes (Signed)
 Made provider aware that the Pt was YELLOW for MEWS purposes due to his tempeture. Medication was given and that was effective.

## 2024-02-13 NOTE — Care Management Important Message (Signed)
 Important Message  Patient Details  Name: Richard Osborne MRN: [id number] Date of Birth: 07-Jul-1934   Important Message Given:  Yes - Medicare IM     Felix Host 02/13/2024, 3:03 PM

## 2024-02-13 NOTE — TOC Initial Note (Addendum)
 Transition of Care [facility]) - Initial/Assessment Note    Patient Details  Name: Richard Osborne MRN: [id number] Date of Birth: 1934/04/16  Transition of Care [facility]) CM/SW Contact:    Riana Tessmer A [place], LCSW Phone Number: 02/13/2024, 3:57 PM  Clinical Narrative:                  CSW met with pt at bedside, from home alone. Has sister as support system.   Pt stated that he was agreeable to SNF at discharge. SNF workup completed. Bed offers pending. No preference for facility.  CSW also reached out to pt's sister Almetta, informed her of recommendation, will follow up as needed for updates on disposition.   TOC will continue to follow.    Expected Discharge Plan: Skilled Nursing Facility Barriers to Discharge: SNF Pending bed offer, Continued Medical Work up, English as a second language teacher   Patient Goals and CMS Choice            Expected Discharge Plan and Services In-house Referral: Clinical Social Work     Living arrangements for the past 2 months: Single Family Home                                      Prior Living Arrangements/Services Living arrangements for the past 2 months: Single Family Home Lives with:: Self          Need for Family Participation in Patient Care: No (Comment) Care giver support system in place?: Yes (comment) (pt's sister Almetta)      Activities of Daily Living   ADL Screening (condition at time of admission) Independently performs ADLs?: Yes (appropriate for developmental age) Is the patient deaf or have difficulty hearing?: No Does the patient have difficulty seeing, even when wearing glasses/contacts?: No Does the patient have difficulty concentrating, remembering, or making decisions?: No  Permission Sought/Granted                  Emotional Assessment Appearance:: Appears stated age Attitude/Demeanor/Rapport: Engaged Affect (typically observed): Calm Orientation: : Oriented to Self, Oriented to Place, Oriented to  Time,  Oriented to Situation Alcohol / Substance Use: Not Applicable Psych Involvement: No (comment)  Admission diagnosis:  Rhabdomyolysis [M62.82] Elevated troponin [R79.89] AKI (acute kidney injury) (HCC) [N17.9] Contusion of right knee, initial encounter [S80.01XA] Fall at home, initial encounter [W19.XXXA, Y92.009] Non-traumatic rhabdomyolysis [M62.82] Patient Active Problem List   Diagnosis Date Noted   Rhabdomyolysis 02/10/2024   Benign hypertension 02/10/2024   Neck pain 02/10/2024   Bladder cancer (HCC) 02/10/2024   Elevated troponin 02/10/2024   Weakness 02/10/2024   Acute renal failure superimposed on stage 3a chronic kidney disease (HCC) 02/10/2024   SIRS (systemic inflammatory response syndrome) (HCC) 02/10/2024   Memory loss 09/26/2023   Pseudogout of left knee 06/01/2021   History of prostate cancer 12/01/2020   Gastroesophageal reflux disease 02/26/2020   Stage 3a chronic kidney disease (HCC) 01/02/2020   Pacemaker 03/11/2016   Dyslipidemia 02/28/2015   Hyperlipidemia 02/28/2015   PCP:  [name], MD Pharmacy:   [facility] [place] [facility] [address] [place] [address] Phone: [phone] Fax: [phone]  [facility] [address] [place], [place] - [address] [place] [address] Phone: [phone] Fax: [phone]     Social Drivers of Health (SDOH) Social History: SDOH Screenings   Food  Insecurity: No Food Insecurity (02/10/2024)  Housing: Low Risk  (02/10/2024)  Transportation Needs: No Transportation Needs (02/10/2024)  Utilities: Not At Risk (02/10/2024)  Social Connections: Unknown (02/10/2024)  Tobacco Use: Low Risk  (02/09/2024)   SDOH Interventions: Food Insecurity Interventions: Intervention Not Indicated Housing Interventions: Intervention Not Indicated Transportation Interventions: Intervention Not Indicated Utilities Interventions: Intervention Not  Indicated Social Connections Interventions: Intervention Not Indicated   Readmission Risk Interventions     No data to display

## 2024-02-13 NOTE — Plan of Care (Signed)

## 2024-02-14 DIAGNOSIS — M6282 Rhabdomyolysis: Secondary | ICD-10-CM | POA: Diagnosis not present

## 2024-02-14 LAB — CBC WITH DIFFERENTIAL/PLATELET
Abs Immature Granulocytes: 0.17 10*3/uL — ABNORMAL HIGH (ref 0.00–0.07)
Basophils Absolute: 0 10*3/uL (ref 0.0–0.1)
Basophils Relative: 0 %
Eosinophils Absolute: 0 10*3/uL (ref 0.0–0.5)
Eosinophils Relative: 0 %
HCT: 34.7 % — ABNORMAL LOW (ref 39.0–52.0)
Hemoglobin: 11.3 g/dL — ABNORMAL LOW (ref 13.0–17.0)
Immature Granulocytes: 1 %
Lymphocytes Relative: 8 %
Lymphs Abs: 1.3 10*3/uL (ref 0.7–4.0)
MCH: 29.5 pg (ref 26.0–34.0)
MCHC: 32.6 g/dL (ref 30.0–36.0)
MCV: 90.6 fL (ref 80.0–100.0)
Monocytes Absolute: 2.6 10*3/uL — ABNORMAL HIGH (ref 0.1–1.0)
Monocytes Relative: 15 %
Neutro Abs: 13.2 10*3/uL — ABNORMAL HIGH (ref 1.7–7.7)
Neutrophils Relative %: 76 %
Platelets: 283 10*3/uL (ref 150–400)
RBC: 3.83 MIL/uL — ABNORMAL LOW (ref 4.22–5.81)
RDW: 12.7 % (ref 11.5–15.5)
WBC: 17.2 10*3/uL — ABNORMAL HIGH (ref 4.0–10.5)
nRBC: 0 % (ref 0.0–0.2)

## 2024-02-14 LAB — CULTURE, BLOOD (ROUTINE X 2)
Culture: NO GROWTH
Culture: NO GROWTH
Special Requests: ADEQUATE

## 2024-02-14 LAB — COMPREHENSIVE METABOLIC PANEL WITH GFR
ALT: 32 U/L (ref 0–44)
AST: 39 U/L (ref 15–41)
Albumin: 2.3 g/dL — ABNORMAL LOW (ref 3.5–5.0)
Alkaline Phosphatase: 62 U/L (ref 38–126)
Anion gap: 8 (ref 5–15)
BUN: 22 mg/dL (ref 8–23)
CO2: 24 mmol/L (ref 22–32)
Calcium: 9.1 mg/dL (ref 8.9–10.3)
Chloride: 106 mmol/L (ref 98–111)
Creatinine, Ser: 1.56 mg/dL — ABNORMAL HIGH (ref 0.61–1.24)
GFR, Estimated: 43 mL/min — ABNORMAL LOW (ref >60)
Glucose, Bld: 107 mg/dL — ABNORMAL HIGH (ref 70–99)
Potassium: 4 mmol/L (ref 3.5–5.1)
Sodium: 138 mmol/L (ref 135–145)
Total Bilirubin: 0.9 mg/dL (ref 0.0–1.2)
Total Protein: 6.8 g/dL (ref 6.5–8.1)

## 2024-02-14 LAB — MAGNESIUM: Magnesium: 1.9 mg/dL (ref 1.7–2.4)

## 2024-02-14 LAB — PHOSPHORUS: Phosphorus: 2.8 mg/dL (ref 2.5–4.6)

## 2024-02-14 LAB — URIC ACID: Uric Acid, Serum: 7 mg/dL (ref 3.7–8.6)

## 2024-02-14 MED ORDER — METHOCARBAMOL 500 MG PO TABS: 500.0000 mg | ORAL_TABLET | Freq: Four times a day (QID) | ORAL | Status: DC | PRN

## 2024-02-14 MED ORDER — ACETAMINOPHEN 500 MG PO TABS
1000.0000 mg | ORAL_TABLET | Freq: Four times a day (QID) | ORAL | Status: DC
  Administered 2024-02-14 – 2024-02-16 (×8): 1000 mg via ORAL
  Filled 2024-02-14 (×7): qty 2

## 2024-02-14 MED ORDER — LACTATED RINGERS IV SOLN: INTRAVENOUS | Status: DC

## 2024-02-14 NOTE — NC FL2 (Signed)
 [place]  MEDICAID FL2 LEVEL OF CARE FORM     IDENTIFICATION  Patient Name: Richard Osborne Birthdate: 09-12-1934 Sex: male Admission Date (Current Location): 02/09/2024  [facility] and [place] Number:  [employer] and Address:  [facility] [place], [address] [place], [place] [address]      Provider Number: [id number]  Attending Physician Name and Address:  [name], MD  Relative Name and Phone Number:  Rhetta Cellar (Sister)  [phone]    Current Level of Care: Hospital Recommended Level of Care: Skilled Nursing Facility Prior Approval Number:    Date Approved/Denied:   PASRR Number: screen still running  Discharge Plan: SNF    Current Diagnoses: Patient Active Problem List   Diagnosis Date Noted   Rhabdomyolysis 02/10/2024   Benign hypertension 02/10/2024   Neck pain 02/10/2024   Bladder cancer (HCC) 02/10/2024   Elevated troponin 02/10/2024   Weakness 02/10/2024   Acute renal failure superimposed on stage 3a chronic kidney disease (HCC) 02/10/2024   SIRS (systemic inflammatory response syndrome) (HCC) 02/10/2024   Memory loss 09/26/2023   Pseudogout of left knee 06/01/2021   History of prostate cancer 12/01/2020   Gastroesophageal reflux disease 02/26/2020   Stage 3a chronic kidney disease (HCC) 01/02/2020   Pacemaker 03/11/2016   Dyslipidemia 02/28/2015   Hyperlipidemia 02/28/2015    Orientation RESPIRATION BLADDER Height & Weight     Self  Normal Continent Weight: 162 lb (73.5 kg) Height:  5\' 10"  (177.8 cm)  BEHAVIORAL SYMPTOMS/MOOD NEUROLOGICAL BOWEL NUTRITION STATUS      Continent Diet (see dc summary)  AMBULATORY STATUS COMMUNICATION OF NEEDS Skin   Limited Assist Verbally Normal                       Personal Care Assistance Level of Assistance  Bathing, Feeding, Dressing Bathing Assistance: Maximum assistance Feeding assistance: Limited assistance Dressing Assistance: Maximum assistance      Functional Limitations Info  Sight, Hearing, Speech Sight Info: Impaired Hearing Info: Adequate Speech Info: Adequate    SPECIAL CARE FACTORS FREQUENCY  PT (By licensed PT), OT (By licensed OT)     PT Frequency: 5x/week OT Frequency: 5x/week            Contractures Contractures Info: Not present    Additional Factors Info  Code Status, Allergies Code Status Info: FULL Allergies Info: Atorvastatin  Fluvastatin  Meloxicam  Salsalate  Simvastatin           Current Medications (02/14/2024):  This is the current hospital active medication list Current Facility-Administered Medications  Medication Dose Route Frequency Provider Last Rate Last Admin   acetaminophen  (TYLENOL ) tablet 1,000 mg  1,000 mg Oral Q6H Samtani, Jai-Gurmukh, MD   1,000 mg at 02/14/24 1254   amLODipine (NORVASC) tablet 5 mg  5 mg Oral Daily Raymona Caldwell, MD   5 mg at 02/14/24 0841   colchicine tablet 0.6 mg  0.6 mg Oral Daily Sheikh, Omair Liberty, DO   0.6 mg at 02/14/24 5621   diclofenac  Sodium (VOLTAREN ) 1 % topical gel 2 g  2 g Topical QID PRN Raymona Caldwell, MD   2 g at 02/13/24 0915   docusate sodium  (COLACE) capsule 100 mg  100 mg Oral Q12H Raymona Caldwell, MD   100 mg at 02/14/24 0840   enoxaparin (LOVENOX) injection 40 mg  40 mg Subcutaneous Q24H Hammons, Kimberly B, RPH   40 mg at 02/14/24 0851   HYDROcodone-acetaminophen  (NORCO/VICODIN) 5-325 MG per  tablet 1 tablet  1 tablet Oral Q4H PRN Raymona Caldwell, MD   1 tablet at 02/13/24 1722   lidocaine  (LIDODERM ) 5 % 1 patch  1 patch Transdermal Q24H Sheikh, Omair Latif, DO   1 patch at 02/13/24 1723   lidocaine  (PF) (XYLOCAINE ) 1 % injection 5 mL  5 mL Intradermal Once Sheikh, Omair Latif, DO       loratadine  (CLARITIN ) tablet 10 mg  10 mg Oral Daily Raymona Caldwell, MD   10 mg at 02/14/24 0840   methocarbamol  (ROBAXIN ) tablet 500 mg  500 mg Oral Q6H PRN Samtani, Jai-Gurmukh, MD       metoprolol tartrate (LOPRESSOR) injection 5 mg  5 mg Intravenous Q6H PRN  Raymona Caldwell, MD       ondansetron (ZOFRAN) tablet 4 mg  4 mg Oral Q6H PRN Raymona Caldwell, MD   4 mg at 02/11/24 2322   Or   ondansetron (ZOFRAN) injection 4 mg  4 mg Intravenous Q6H PRN Raymona Caldwell, MD       pantoprazole (PROTONIX) EC tablet 40 mg  40 mg Oral Daily Raymona Caldwell, MD   40 mg at 02/14/24 0839   predniSONE  (DELTASONE ) tablet 60 mg  60 mg Oral Q breakfast Sheikh, Omair Latif, DO   60 mg at 02/14/24 1610     Discharge Medications: Please see discharge summary for a list of discharge medications.  Relevant Imaging Results:  Relevant Lab Results:   Additional Information RUE:[id number]  Dartanian Knaggs A [place], LCSW

## 2024-02-14 NOTE — Plan of Care (Signed)

## 2024-02-14 NOTE — Progress Notes (Signed)
 TRH ROUNDING NOTE Richard Osborne WUJ:[id number]  DOB: 1934/07/23  DOA: 02/09/2024  PCP: [name], MD  02/14/2024,10:45 AM  LOS: 4 days    Code Status: Full code   from: Home current Dispo: Unclear   89 year old black male known history of HLD HTN Prior cardiac cath 1999 with minimal stenosis Stage IIIb CKD  prostate cancer bladder cancer status post TURBT and induction BCG in 03/29/2017-status post hydrocelectomy 23 [facility] BPH without LUTS  high degree AV block status post PPM placed 2016--underlying history of A-fib/  Feeling weak for about 1 week prior to admission Had a fall 5/3 was down for while he was also dose-limiting and having hard time with hip pain and joint pain Sodium 143 potassium 3.9 AST/ALT slightly elevated 6/22 CK9 85 B12 172 WBC 15 procalcitonin 2.5 hemoglobin 12 platelet 216 urine culture done no growth CT soft tissue neck no evidence of abnormality CT lumbar spine moderate to severe diffuse degenerative disc disease no fracture or listhesis Bilateral hip x-rays severe left and moderate right hip degenerative arthritis CT right knee moderate to severe tricompartmental degenerative changes of the knee with chondrocalcinosis moderate knee joint effusion  Because of persistent confusion and some hallucinations brain MRI was ordered showing no evidence of acute intracranial abnormality  because of persistently swollen right knee underwent knee aspiration 02/12/2024 showing cloudy appearance 84% with fails and indications of pseudogout note  Plan  Toxic metabolic encephalopathy probably secondary to rhabdo, gout with SIRS criteria secondary to mild rhabdo Overall improved B12 close 172, getting 5 days IM, TSH was 1.57   Rhabdomyolysis secondary to being found down and immobile Last CK level below 500 about 209--- IVF previously used discontinued  Recent 7 neck pain Placed on heating pad no red flags on CT cervical spine imaging   AKI from acute rhabdo  superimposed on CKD 3B Saline lock IV creatinine seems to be at baseline  Leukocytosis gouty arthritis right knee Right knee aspiration as above--blood culture X25/5 negative, body fluid culture from synovial showed abundant WBC but no growth Uric acid was 7.0 Antibiotics discontinued #7 and monitor trends Patient has been placed on prednisone  60 daily 6 daily and we will monitor for effect and improvement Can continue Norco 1 tab every 4 as needed moderate pain Will schedule Tylenol  1000 every 6, continue Robaxin  as 500 every 6 as needed spasm   bladder cancer TURBT 01/24/2017 final pathology G3 TA and induction 03/20/2018  Prostate cancer iodine 125 2 [facility] follow-up Dr. [name]  High degree AV block PPM placed 2016 with underlying Not on rate control presumably because of AV block   HTN Holding Vasotec 20 twice daily, Lasix 20 twice daily Resumed amlodipine 5   DVT prophylaxis: Lovenox  Status is: Inpatient Remains inpatient appropriate because:   Requires inpatient workup and improvement of pain   Subjective: Severe pain in bilateral knees more so on the right today able to tell me date time year and president He seems to be picking at his knee  etc.  Objective + exam Vitals:   02/13/24 2125 02/14/24 0447 02/14/24 0816 02/14/24 0841  BP: (!) 141/65 (!) 165/78 (!) 159/83 (!) 159/83  Pulse: 98 85 94   Resp: 17 17 17    Temp: 100.3 F (37.9 C) 98.2 F (36.8 C) 98.2 F (36.8 C)   TempSrc: Oral Oral    SpO2: 96% 98% 99%   Weight:      Height:  Filed Weights   02/09/24 2044  Weight: 73.5 kg    Examination: Awake coherent well-appearing a little bit distracted S1-S2 no murmur Chest clear no added sound no wheeze rales rhonchi Abdo menance soft no rebound no guarding Right knee is slightly swollen compared to the left knee power is 5/5 Knee is still a little warm  Data Reviewed: reviewed   CBC    Component Value Date/Time   WBC 17.2 (H)  02/14/2024 0618   RBC 3.83 (L) 02/14/2024 0618   HGB 11.3 (L) 02/14/2024 0618   HCT 34.7 (L) 02/14/2024 0618   PLT 283 02/14/2024 0618   MCV 90.6 02/14/2024 0618   MCH 29.5 02/14/2024 0618   MCHC 32.6 02/14/2024 0618   RDW 12.7 02/14/2024 0618   LYMPHSABS 1.3 02/14/2024 0618   MONOABS 2.6 (H) 02/14/2024 0618   EOSABS 0.0 02/14/2024 0618   BASOSABS 0.0 02/14/2024 0618      Latest Ref Rng & Units 02/14/2024    6:18 AM 02/13/2024    6:23 AM 02/12/2024    7:14 AM  CMP  Glucose 70 - 99 mg/dL 045  409  96   BUN 8 - 23 mg/dL 22  22  23    Creatinine 0.61 - 1.24 mg/dL 8.11  9.14  7.82   Sodium 135 - 145 mmol/L 138  139  139   Potassium 3.5 - 5.1 mmol/L 4.0  4.4  4.4   Chloride 98 - 111 mmol/L 106  107  107   CO2 22 - 32 mmol/L 24  23  21    Calcium 8.9 - 10.3 mg/dL 9.1  8.5  8.8   Total Protein 6.5 - 8.1 g/dL 6.8  6.0  6.3   Total Bilirubin 0.0 - 1.2 mg/dL 0.9  0.9  0.8   Alkaline Phos 38 - 126 U/L 62  47  49   AST 15 - 41 U/L 39  35  42   ALT 0 - 44 U/L 32  26  29     Scheduled Meds:  amLODipine  5 mg Oral Daily   colchicine  0.6 mg Oral Daily   docusate sodium   100 mg Oral Q12H   enoxaparin (LOVENOX) injection  40 mg Subcutaneous Q24H   lidocaine   1 patch Transdermal Q24H   lidocaine  (PF)  5 mL Intradermal Once   loratadine   10 mg Oral Daily   pantoprazole  40 mg Oral Daily   predniSONE   60 mg Oral Q breakfast   Continuous Infusions:  ceFEPime (MAXIPIME) IV 2 g (02/14/24 0851)   lactated ringers  50 mL/hr at 02/14/24 0427   vancomycin Stopped (02/13/24 1827)    Time  44  [name], MD  [facility]

## 2024-02-14 NOTE — Progress Notes (Signed)
 Per MD request to ambulate patient to chair. 2 assist needed, max assist. Patient was not successful using steady, was not able to stand up enough to place seat under bottom. Per nursing judgment, safety is a concern for both patient and staff. Patient was sat back down to sit on edge of bed. Will attempt again at a later time today. Bed in lowest position, call light within reach, and bed alarm on.

## 2024-02-14 NOTE — Progress Notes (Signed)
 Placed floor mats at bedside due to being a fall risk.

## 2024-02-14 NOTE — Progress Notes (Signed)
 Pt stated he had a bowel movement on 02/11/2024, can't confirm or deny.

## 2024-02-14 NOTE — Progress Notes (Signed)
 Pt LR orders expired sent MD a message advising what to do. MD renewed orders but at a different rate. Hung fluids and Administered fluids at new rate.

## 2024-02-14 NOTE — TOC Progression Note (Signed)
 Transition of Care [facility]) - Progression Note    Patient Details  Name: [name] MRN: [id number] Date of Birth: April 18, 1934  Transition of Care [facility]) CM/SW Contact  Balthazar Dooly A [place], LCSW Phone Number: 02/14/2024, 2:17 PM  Clinical Narrative:      CSW met with pt at bedside, provided bed offers with Medicare.gov ratings. Possible acceptance for [facility] rehab if facilities closer to pt's home do not provide bed offer. CSW awaiting response from other facilities.    TOC will continue to follow.  Expected Discharge Plan: Skilled Nursing Facility Barriers to Discharge: SNF Pending bed offer, Continued Medical Work up, English as a second language teacher  Expected Discharge Plan and Services In-house Referral: Clinical Social Work     Living arrangements for the past 2 months: Single Family Home                                       Social Determinants of Health (SDOH) Interventions SDOH Screenings   Food Insecurity: No Food Insecurity (02/10/2024)  Housing: Low Risk  (02/10/2024)  Transportation Needs: No Transportation Needs (02/10/2024)  Utilities: Not At Risk (02/10/2024)  Social Connections: Unknown (02/10/2024)  Tobacco Use: Low Risk  (02/09/2024)    Readmission Risk Interventions     No data to display

## 2024-02-14 NOTE — Progress Notes (Signed)
 Occupational Therapy Treatment Patient Details Name: Richard Osborne MRN: [id number] DOB: [birth year] Today's Date: 02/14/2024   History of present illness Pt is an 89 y/o M presenting to ED on 5/2 after being found on floor, down x6-7 hours, found to have rhabdomyolysis with elevated CK and acute on chronic kidney injury. PMH includes prostate CA, HTN, hypercholesteremia, meniere's disease.   OT comments  Pt progressing well towards goals. Today's session focused on cognition. Pt unable to complete 2 step commands, following one step commands with increased time. Pt not oriented to time, and stating " I got up and walked and used the bathroom, they are letting me leave tomorrow". Short Blessed Test performed, with pt scoring >10 indicating significant cog impairment. Progress made with completing grooming seated EOB no LOB, and set up assist for eating bed level. Continue to recommend <3 hours of skilled rehab daily to optimize independence levels. Will continue to follow acutely.       If plan is discharge home, recommend the following:  Two people to help with walking and/or transfers;A lot of help with bathing/dressing/bathroom;Assistance with cooking/housework;Direct supervision/assist for medications management;Direct supervision/assist for financial management;Assist for transportation;Help with stairs or ramp for entrance   Equipment Recommendations  Tub/shower seat;BSC/3in1       Precautions / Restrictions Precautions Precautions: Fall Recall of Precautions/Restrictions: Impaired Restrictions Weight Bearing Restrictions Per Provider Order: No       Mobility Bed Mobility Overal bed mobility: Needs Assistance Bed Mobility: Supine to Sit, Sit to Supine     Supine to sit: Contact guard Sit to supine: Contact guard assist, HOB elevated, Used rails   General bed mobility comments: Increased time but no physical assist    Transfers Overall transfer level: Needs assistance        Balance Overall balance assessment: Needs assistance Sitting-balance support: Feet supported Sitting balance-Leahy Scale: Fair         ADL either performed or assessed with clinical judgement   ADL Overall ADL's : Needs assistance/impaired Eating/Feeding: Set up;Bed level   Grooming: Wash/dry face;Sitting Grooming Details (indicate cue type and reason): No LOB sitting without UE support             Lower Body Dressing: Maximal assistance;Sit to/from stand Lower Body Dressing Details (indicate cue type and reason): Pt able to don socks seated EOB with increased time and effort, max assist for pants               General ADL Comments: Decreased cog and safety awareness, limiting session    Extremity/Trunk Assessment Upper Extremity Assessment Upper Extremity Assessment: Generalized weakness   Lower Extremity Assessment Lower Extremity Assessment: Defer to PT evaluation        Vision   Vision Assessment?: No apparent visual deficits         Communication Communication Communication: No apparent difficulties   Cognition Arousal: Alert Behavior During Therapy: Flat affect Cognition: Cognition impaired   Orientation impairments: Time, Situation Awareness: Intellectual awareness impaired, Online awareness impaired   Attention impairment (select first level of impairment): Sustained attention Executive functioning impairment (select all impairments): Sequencing, Organization, Problem solving OT - Cognition Comments: Pt unaware of time and unaware of any deficits he may have. per pt " I walked to the bathroom today" per RN note pt unable to stand with use of stedy         Following commands: Impaired Following commands impaired: Follows one step commands with increased time      Cueing  Cueing Techniques: Verbal cues        General Comments VSS on RA    Pertinent Vitals/ Pain       Pain Assessment Pain Assessment: Faces Faces Pain Scale: Hurts  a little bit Pain Location: R knee Pain Descriptors / Indicators: Discomfort, Grimacing, Guarding Pain Intervention(s): Limited activity within patient's tolerance   Frequency  Min 2X/week        Progress Toward Goals  OT Goals(current goals can now be found in the care plan section)  Progress towards OT goals: Progressing toward goals  Acute Rehab OT Goals Patient Stated Goal: To go home OT Goal Formulation: With patient Time For Goal Achievement: 02/25/24 Potential to Achieve Goals: Good ADL Goals Pt Will Perform Grooming: with modified independence;sitting Pt Will Perform Upper Body Dressing: with modified independence;standing;sitting Pt Will Perform Lower Body Dressing: with modified independence;sitting/lateral leans;sit to/from stand Pt Will Transfer to Toilet: with modified independence;ambulating;regular height toilet Pt Will Perform Tub/Shower Transfer: Tub transfer;Shower transfer;with modified independence;ambulating;shower seat  Plan         AM-PAC OT "6 Clicks" Daily Activity     Outcome Measure   Help from another person eating meals?: None Help from another person taking care of personal grooming?: A Little Help from another person toileting, which includes using toliet, bedpan, or urinal?: A Lot Help from another person bathing (including washing, rinsing, drying)?: A Lot Help from another person to put on and taking off regular upper body clothing?: A Little Help from another person to put on and taking off regular lower body clothing?: A Lot 6 Click Score: 16    End of Session    OT Visit Diagnosis: Unsteadiness on feet (R26.81);Other abnormalities of gait and mobility (R26.89);Muscle weakness (generalized) (M62.81);History of falling (Z91.81)   Activity Tolerance Patient tolerated treatment well   Patient Left in bed;with call bell/phone within reach;with bed alarm set   Nurse Communication Mobility status        Time: 1610-9604 OT Time  Calculation (min): 19 min  Charges: OT General Charges $OT Visit: 1 Visit OT Treatments $Self Care/Home Management : 8-22 mins  Delmer Ferraris, OT  Acute Rehabilitation Services Office [phone] Secure chat preferred   Mickael Alamo 02/14/2024, 5:13 PM

## 2024-02-15 DIAGNOSIS — M6282 Rhabdomyolysis: Secondary | ICD-10-CM | POA: Diagnosis not present

## 2024-02-15 LAB — CBC WITH DIFFERENTIAL/PLATELET
Abs Immature Granulocytes: 0.16 10*3/uL — ABNORMAL HIGH (ref 0.00–0.07)
Basophils Absolute: 0 10*3/uL (ref 0.0–0.1)
Basophils Relative: 0 %
Eosinophils Absolute: 0 10*3/uL (ref 0.0–0.5)
Eosinophils Relative: 0 %
HCT: 35.8 % — ABNORMAL LOW (ref 39.0–52.0)
Hemoglobin: 11.5 g/dL — ABNORMAL LOW (ref 13.0–17.0)
Immature Granulocytes: 1 %
Lymphocytes Relative: 7 %
Lymphs Abs: 1.5 10*3/uL (ref 0.7–4.0)
MCH: 29.5 pg (ref 26.0–34.0)
MCHC: 32.1 g/dL (ref 30.0–36.0)
MCV: 91.8 fL (ref 80.0–100.0)
Monocytes Absolute: 2.4 10*3/uL — ABNORMAL HIGH (ref 0.1–1.0)
Monocytes Relative: 12 %
Neutro Abs: 16.6 10*3/uL — ABNORMAL HIGH (ref 1.7–7.7)
Neutrophils Relative %: 80 %
Platelets: 294 10*3/uL (ref 150–400)
RBC: 3.9 MIL/uL — ABNORMAL LOW (ref 4.22–5.81)
RDW: 12.7 % (ref 11.5–15.5)
WBC: 20.7 10*3/uL — ABNORMAL HIGH (ref 4.0–10.5)
nRBC: 0 % (ref 0.0–0.2)

## 2024-02-15 LAB — BASIC METABOLIC PANEL WITH GFR
Anion gap: 11 (ref 5–15)
BUN: 26 mg/dL — ABNORMAL HIGH (ref 8–23)
CO2: 22 mmol/L (ref 22–32)
Calcium: 9.2 mg/dL (ref 8.9–10.3)
Chloride: 105 mmol/L (ref 98–111)
Creatinine, Ser: 1.22 mg/dL (ref 0.61–1.24)
GFR, Estimated: 57 mL/min — ABNORMAL LOW (ref >60)
Glucose, Bld: 107 mg/dL — ABNORMAL HIGH (ref 70–99)
Potassium: 4.7 mmol/L (ref 3.5–5.1)
Sodium: 138 mmol/L (ref 135–145)

## 2024-02-15 NOTE — Progress Notes (Signed)
 Physical Therapy Treatment Patient Details Name: Richard Osborne MRN: [id number] DOB: 07-01-1934 Today's Date: 02/15/2024   History of Present Illness Pt is an 89 y/o M presenting to ED on 5/2 after being found on floor, down x6-7 hours, found to have rhabdomyolysis with elevated CK and acute on chronic kidney injury. PMH includes prostate CA, HTN, hypercholesteremia, meniere's disease.    PT Comments  Patient seen with mobility specialist for activity progression.  Able to stand with RW and take steps to recliner though with posterior bias and heavy lifting help to stand from EOB.  Had pain in R knee > L with standing activity.  Seems more oriented today as well.  Eager for home, though continued need for post-acute inpatient rehab (<3 hours/day) prior to d/c home.     If plan is discharge home, recommend the following: Two people to help with walking and/or transfers;Two people to help with bathing/dressing/bathroom   Can travel by private vehicle     No  Equipment Recommendations       Recommendations for Other Services       Precautions / Restrictions Precautions Precautions: Fall Recall of Precautions/Restrictions: Impaired     Mobility  Bed Mobility Overal bed mobility: Needs Assistance Bed Mobility: Supine to Sit     Supine to sit: Min assist, Used rails, HOB elevated     General bed mobility comments: little help to initiate with R LE off EOB    Transfers Overall transfer level: Needs assistance Equipment used: Rolling walker (2 wheels) Transfers: Sit to/from Stand, Bed to chair/wheelchair/BSC Sit to Stand: +2 physical assistance, Mod assist, From elevated surface, +2 safety/equipment   Step pivot transfers: Mod assist, +2 physical assistance, +2 safety/equipment       General transfer comment: good amount of lifting help to stand; pt somewhat posterior, though stepping to recliner with A of 2 and RW    Ambulation/Gait                   Stairs              Wheelchair Mobility     Tilt Bed    Modified Rankin (Stroke Patients Only)       Balance Overall balance assessment: Needs assistance Sitting-balance support: Feet supported Sitting balance-Leahy Scale: Fair     Standing balance support: Bilateral upper extremity supported, Reliant on assistive device for balance Standing balance-Leahy Scale: Poor Standing balance comment: static balance with RW and support                            Communication Communication Communication: No apparent difficulties  Cognition Arousal: Alert Behavior During Therapy: WFL for tasks assessed/performed   PT - Cognitive impairments: No apparent impairments                       PT - Cognition Comments: oriented x 4 Following commands: Impaired Following commands impaired: Follows one step commands with increased time    Cueing Cueing Techniques: Verbal cues  Exercises General Exercises - Lower Extremity Ankle Circles/Pumps: AROM, Both, 5 reps, Supine Long Arc Quad: Strengthening, Both, 20 reps, Seated Heel Slides: AROM, Both, 5 reps, Supine Other Exercises Other Exercises: trunk flex/ext in chair arms crossed over chest x 10    General Comments        Pertinent Vitals/Pain Pain Assessment Pain Score: 6  Pain Location: R knee Pain Descriptors / Indicators:  Discomfort, Grimacing, Guarding Pain Intervention(s): Monitored during session, Repositioned    Home Living                          Prior Function            PT Goals (current goals can now be found in the care plan section) Progress towards PT goals: Progressing toward goals    Frequency    Min 2X/week      PT Plan      Co-evaluation              AM-PAC PT "6 Clicks" Mobility   Outcome Measure  Help needed turning from your back to your side while in a flat bed without using bedrails?: A Little Help needed moving from lying on your back to sitting on  the side of a flat bed without using bedrails?: A Little Help needed moving to and from a bed to a chair (including a wheelchair)?: Total Help needed standing up from a chair using your arms (e.g., wheelchair or bedside chair)?: Total Help needed to walk in hospital room?: Total Help needed climbing 3-5 steps with a railing? : Total 6 Click Score: 10    End of Session Equipment Utilized During Treatment: Gait belt Activity Tolerance: Patient limited by pain Patient left: in chair;with call bell/phone within reach;with chair alarm set Nurse Communication: Mobility status PT Visit Diagnosis: Other abnormalities of gait and mobility (R26.89);Muscle weakness (generalized) (M62.81);Pain Pain - Right/Left: Right Pain - part of body: Leg     Time: 1096-0454 PT Time Calculation (min) (ACUTE ONLY): 24 min  Charges:    $Therapeutic Exercise: 8-22 mins $Therapeutic Activity: 8-22 mins PT General Charges $$ ACUTE PT VISIT: 1 Visit                     Abigail Hoff, PT Acute Rehabilitation Services Office:[phone] 02/15/2024    Marley Simmers 02/15/2024, 1:22 PM

## 2024-02-15 NOTE — Progress Notes (Signed)
 Pt began to experience an increase in confusion and agitation. He was trying to exit the bed and find his "keys and wallet" he was made aware that his family members took his belongings and that the items were safe with them. Pt still persisted we find his items and for me to give him all of his clothing . This was done to appease the Pt, once he was able to see for himself he allowed us  to put him back in the bed.

## 2024-02-15 NOTE — Plan of Care (Signed)

## 2024-02-15 NOTE — Progress Notes (Signed)
 Mobility Specialist Progress Note:    02/15/24 1508  Mobility  Activity Transferred from chair to bed  Level of Assistance Moderate assist, patient does 50-74% (+2)  Assistive Device Front wheel walker  Distance Ambulated (ft) 3 ft  Activity Response Tolerated well  Mobility Referral Yes  Mobility visit 1 Mobility  Mobility Specialist Start Time (ACUTE ONLY) 1405  Mobility Specialist Stop Time (ACUTE ONLY) 1420  Mobility Specialist Time Calculation (min) (ACUTE ONLY) 15 min   Pt received in chair agreeable to mobility. Pt required ModA +2 for STS and contact guard during transfer. Pt needed no physical assistance w/ bed mobility. No c/o throughout. Call bell and personal belongings in reach. All needs met. Bed alarm on.   Richard Osborne Mobility Specialist  Please contact vis Secure Chat or  Rehab Office [phone]

## 2024-02-15 NOTE — Progress Notes (Signed)
 During the night the Pt was having issues using the urinal, he stated he couldn't get to it in time and that he can't hold his urine. Made provider aware of this change in status and she gave an order for an exiting catheter.

## 2024-02-15 NOTE — TOC Progression Note (Addendum)
 Transition of Care [facility]) - Progression Note    Patient Details  Name: Richard Osborne MRN: [id number] Date of Birth: 04/19/1934  Transition of [facility]) CM/SW Contact  Ernst Heap Phone Number: [phone] 02/15/2024, 3:03 PM  Clinical Narrative:  CSW met with patient at bedside to present bed offers. Patient desired to be at a facility close to home. Patient stated that he was unsure about going to a SNF and even appeared unsure with the faiclity option of being close to home. Patient stated that he would like to go home VS SNF.   CSW sent a secure chat to the MD and PT to share updates about patients feelings towards SNF. Will follow up.   TOC will continue following.      Expected Discharge Plan: Skilled Nursing Facility Barriers to Discharge: SNF Pending bed offer, Continued Medical Work up, English as a second language teacher  Expected Discharge Plan and Services In-house Referral: Clinical Social Work     Living arrangements for the past 2 months: Single Family Home                                       Social Determinants of Health (SDOH) Interventions SDOH Screenings   Food Insecurity: No Food Insecurity (02/10/2024)  Housing: Low Risk  (02/10/2024)  Transportation Needs: No Transportation Needs (02/10/2024)  Utilities: Not At Risk (02/10/2024)  Social Connections: Unknown (02/10/2024)  Tobacco Use: Low Risk  (02/09/2024)    Readmission Risk Interventions     No data to display

## 2024-02-15 NOTE — Plan of Care (Signed)
  Problem: Education: Goal: Knowledge of General Education information will improve Description: Including pain rating scale, medication(s)/side effects and non-pharmacologic comfort measures Outcome: Progressing   Problem: Health Behavior/Discharge Planning: Goal: Ability to manage health-related needs will improve Outcome: Progressing   Problem: Clinical Measurements: Goal: Will remain free from infection Outcome: Progressing Goal: Respiratory complications will improve Outcome: Progressing   Problem: Coping: Goal: Level of anxiety will decrease Outcome: Progressing   Problem: Pain Managment: Goal: General experience of comfort will improve and/or be controlled Outcome: Progressing   Problem: Skin Integrity: Goal: Risk for impaired skin integrity will decrease Outcome: Progressing

## 2024-02-15 NOTE — Progress Notes (Signed)
 Richard Osborne Richard Osborne ZOX:[id number]  DOB: 11-24-1933  DOA: 02/09/2024  PCP: [name], MD  02/15/2024,7:43 AM  LOS: 5 days    Code Status: Full code   from: Home current Dispo: Unclear   89 year old black male known history of HLD HTN Prior cardiac cath 1999 with minimal stenosis Stage IIIb CKD  prostate cancer bladder cancer status post TURBT and induction BCG in 03/29/2017-status post hydrocelectomy 23 [facility] BPH without LUTS  high degree AV block status post PPM placed 2016--underlying history of A-fib/  Feeling weak for about 1 week prior to admission Had a fall 5/3 was down for while he was also dose-limiting and having hard time with hip pain and joint pain Sodium 143 potassium 3.9 AST/ALT slightly elevated 6/22 CK9 85 B12 172 WBC 15 procalcitonin 2.5 hemoglobin 12 platelet 216 urine culture done no growth CT soft tissue neck no evidence of abnormality CT lumbar spine moderate to severe diffuse degenerative disc disease no fracture or listhesis Bilateral hip x-rays severe left and moderate right hip degenerative arthritis CT right knee moderate to severe tricompartmental degenerative changes of the knee with chondrocalcinosis moderate knee joint effusion  Because of persistent confusion and some hallucinations brain MRI was ordered showing no evidence of acute intracranial abnormality  because of persistently swollen right knee underwent knee aspiration 02/12/2024 showing cloudy appearance 84% neutrophils and indications of pseudogout Osborne  Plan  Toxic metabolic encephalopathy 2/2 rhabdo, gout with SIRS criteria and probable underlying dementia B12 close 172, getting 5 days IM, TSH was 1.57 Has some underlying confusion additionally probably secondary to mild dementia additionally  Rhabdomyolysis secondary to being found down and immobile Last CK level below 500 about 209--- saline locked on 5/6-- await labs from this morning  Recent neck pain Placed on heating  pad no red flags on CT cervical spine imaging this overall seems to be well resolved  AKI from acute rhabdo superimposed on CKD 3B As above  Leukocytosis gouty arthritis right knee Right knee aspiration as above--blood culture X2 on 5/5 negative---culture from synovial showed abundant WBC but no growth Uric acid was 7.0 Antibiotics discontinued #7 and monitor trends Patient has been placed on prednisone  60 daily-leukocytosis could also be from this and we will monitor the same Norco 1 tab every 4 as needed moderate pain-- scheduled Tylenol  1000 every 6, continue Robaxin  as 500 every 6 as needed spasm  bladder cancer TURBT 01/24/2017 final pathology G3 TA and induction 03/20/2018  Prostate cancer iodine 125 previously given Outpatient follow-up Dr. [name] at [facility]  High degree AV block PPM placed 2016 with underlying Not on rate control presumably because of AV block   HTN Holding Vasotec 20 twice daily, Lasix 20 twice daily Resumed amlodipine 5   DVT prophylaxis: Lovenox  Status is: Inpatient Remains inpatient appropriate because:   Requires inpatient workup and improvement of pain   Subjective:  Still a little bit confused but states that pain in knees is better and he is having better mobility No fever no chills overnight noted Overall looks relatively comfortable    Objective + exam Vitals:   02/14/24 0841 02/14/24 1638 02/14/24 1953 02/15/24 0411  BP: (!) 159/83 (!) 147/76 (!) 140/74 (!) 141/77  Pulse:  72 70 69  Resp:  18 16 17   Temp:  97.7 F (36.5 C) 97.9 F (36.6 C) 97.7 F (36.5 C)  TempSrc:   Oral Oral  SpO2:   100% 100%  Weight:  Height:       Filed Weights   02/09/24 2044  Weight: 73.5 kg    Examination:  Coherent x 2 awake no icterus no pallor dentition is moderate younger than stated age S1-S2 no murmur Chest is clear no wheeze no rales Right knee is less swollen he is able to move it a little better-left knee is a little warm  additionally No lower extremity edema Patient is able to move his neck without issue  Data Reviewed: reviewed   CBC    Component Value Date/Time   WBC 20.7 (H) 02/15/2024 0632   RBC 3.90 (L) 02/15/2024 0632   HGB 11.5 (L) 02/15/2024 0632   HCT 35.8 (L) 02/15/2024 0632   PLT 294 02/15/2024 0632   MCV 91.8 02/15/2024 0632   MCH 29.5 02/15/2024 0632   MCHC 32.1 02/15/2024 0632   RDW 12.7 02/15/2024 0632   LYMPHSABS 1.5 02/15/2024 0632   MONOABS 2.4 (H) 02/15/2024 0632   EOSABS 0.0 02/15/2024 0632   BASOSABS 0.0 02/15/2024 0632      Latest Ref Rng & Units 02/14/2024    6:18 AM 02/13/2024    6:23 AM 02/12/2024    7:14 AM  CMP  Glucose 70 - 99 mg/dL 098  119  96   BUN 8 - 23 mg/dL 22  22  23    Creatinine 0.61 - 1.24 mg/dL 1.47  8.29  5.62   Sodium 135 - 145 mmol/L 138  139  139   Potassium 3.5 - 5.1 mmol/L 4.0  4.4  4.4   Chloride 98 - 111 mmol/L 106  107  107   CO2 22 - 32 mmol/L 24  23  21    Calcium 8.9 - 10.3 mg/dL 9.1  8.5  8.8   Total Protein 6.5 - 8.1 g/dL 6.8  6.0  6.3   Total Bilirubin 0.0 - 1.2 mg/dL 0.9  0.9  0.8   Alkaline Phos 38 - 126 U/L 62  47  49   AST 15 - 41 U/L 39  35  42   ALT 0 - 44 U/L 32  26  29     Scheduled Meds:  acetaminophen   1,000 mg Oral Q6H   amLODipine  5 mg Oral Daily   colchicine  0.6 mg Oral Daily   docusate sodium   100 mg Oral Q12H   enoxaparin (LOVENOX) injection  40 mg Subcutaneous Q24H   lidocaine   1 patch Transdermal Q24H   lidocaine  (PF)  5 mL Intradermal Once   loratadine   10 mg Oral Daily   pantoprazole  40 mg Oral Daily   predniSONE   60 mg Oral Q breakfast   Continuous Infusions:  Time 26  Richard Glisson, MD  [facility]

## 2024-02-16 ENCOUNTER — Other Ambulatory Visit (HOSPITAL_COMMUNITY): Payer: Self-pay

## 2024-02-16 DIAGNOSIS — M6282 Rhabdomyolysis: Secondary | ICD-10-CM | POA: Diagnosis not present

## 2024-02-16 LAB — CBC WITH DIFFERENTIAL/PLATELET
Abs Immature Granulocytes: 0.17 10*3/uL — ABNORMAL HIGH (ref 0.00–0.07)
Basophils Absolute: 0 10*3/uL (ref 0.0–0.1)
Basophils Relative: 0 %
Eosinophils Absolute: 0 10*3/uL (ref 0.0–0.5)
Eosinophils Relative: 0 %
HCT: 33.6 % — ABNORMAL LOW (ref 39.0–52.0)
Hemoglobin: 11.1 g/dL — ABNORMAL LOW (ref 13.0–17.0)
Immature Granulocytes: 1 %
Lymphocytes Relative: 11 %
Lymphs Abs: 1.7 10*3/uL (ref 0.7–4.0)
MCH: 29.8 pg (ref 26.0–34.0)
MCHC: 33 g/dL (ref 30.0–36.0)
MCV: 90.3 fL (ref 80.0–100.0)
Monocytes Absolute: 1.6 10*3/uL — ABNORMAL HIGH (ref 0.1–1.0)
Monocytes Relative: 10 %
Neutro Abs: 12.2 10*3/uL — ABNORMAL HIGH (ref 1.7–7.7)
Neutrophils Relative %: 78 %
Platelets: 394 10*3/uL (ref 150–400)
RBC: 3.72 MIL/uL — ABNORMAL LOW (ref 4.22–5.81)
RDW: 12.7 % (ref 11.5–15.5)
WBC: 15.6 10*3/uL — ABNORMAL HIGH (ref 4.0–10.5)
nRBC: 0 % (ref 0.0–0.2)

## 2024-02-16 LAB — BODY FLUID CULTURE W GRAM STAIN: Culture: NO GROWTH

## 2024-02-16 LAB — BASIC METABOLIC PANEL WITH GFR
Anion gap: 10 (ref 5–15)
BUN: 27 mg/dL — ABNORMAL HIGH (ref 8–23)
CO2: 22 mmol/L (ref 22–32)
Calcium: 9.1 mg/dL (ref 8.9–10.3)
Chloride: 106 mmol/L (ref 98–111)
Creatinine, Ser: 1.34 mg/dL — ABNORMAL HIGH (ref 0.61–1.24)
GFR, Estimated: 51 mL/min — ABNORMAL LOW (ref >60)
Glucose, Bld: 83 mg/dL (ref 70–99)
Potassium: 4.4 mmol/L (ref 3.5–5.1)
Sodium: 138 mmol/L (ref 135–145)

## 2024-02-16 MED ORDER — COLCHICINE 0.6 MG PO TABS
0.6000 mg | ORAL_TABLET | Freq: Every day | ORAL | 0 refills | Status: AC
  Filled 2024-02-16: qty 30, 30d supply, fill #0

## 2024-02-16 MED ORDER — PREDNISONE 20 MG PO TABS
60.0000 mg | ORAL_TABLET | Freq: Every day | ORAL | 0 refills | Status: DC
  Filled 2024-02-16: qty 4, 1d supply, fill #0

## 2024-02-16 MED ORDER — PREDNISONE 20 MG PO TABS
60.0000 mg | ORAL_TABLET | Freq: Every day | ORAL | 0 refills | Status: AC
  Filled 2024-02-16: qty 12, 4d supply, fill #0

## 2024-02-16 MED ORDER — ACETAMINOPHEN 500 MG PO TABS
1000.0000 mg | ORAL_TABLET | Freq: Four times a day (QID) | ORAL | 0 refills | Status: AC
  Filled 2024-02-16: qty 30, 4d supply, fill #0

## 2024-02-16 NOTE — Progress Notes (Signed)
 Mobility Specialist Progress Note:    02/16/24 1233  Mobility  Activity Ambulated with assistance in hallway  Level of Assistance Minimal assist, patient does 75% or more (+2)  Assistive Device Front wheel walker  Distance Ambulated (ft) 200 ft  Activity Response Tolerated well  Mobility Referral Yes  Mobility visit 1 Mobility  Mobility Specialist Start Time (ACUTE ONLY) 1022  Mobility Specialist Stop Time (ACUTE ONLY) 1037  Mobility Specialist Time Calculation (min) (ACUTE ONLY) 15 min   Pt received in bed agreeable to mobility. Pt required MinA +2 to stand and contact guard to ambulate. Ambulated the halls w/ chair follow for safety. No c/o throughout. Pt was wheeled back to room. Left in chair w/ call bell and personal belongings in reach. All needs met. Chair alarm on.  Inetta Manes Mobility Specialist  Please contact vis Secure Chat or  Rehab Office [phone]

## 2024-02-16 NOTE — Discharge Summary (Addendum)
 Physician Discharge Summary  Richard Osborne ZOX:[id number] DOB: 1934/09/18 DOA: 02/09/2024  PCP: [name], MD  Admit date: 02/09/2024 Discharge date: 02/16/2024  Time spent: 46 minutes  Recommendations for Outpatient Follow-up:  Recommend completion of 3-4 more days of prednisone  60 and then discontinue the same Needs Chem-7 CBC 1 week Home health therapy recommended at discharge as patient plans to go to despite therpay recs for SNF--has capacity to make decisions--- unfortunately these are not really good decisions No changes to hypertensive regimen  Discharge Diagnoses:  MAIN problem for hospitalization   Metabolic encephalopathy on admission Acute gouty arthritis  Please see below for itemized issues addressed in HOpsital- refer to other progress notes for clarity if needed  Discharge Condition: Improved but at risk for fall  Diet recommendation: Heart healthy  Filed Weights   02/09/24 2044  Weight: 73.5 kg    History of present illness:  89 year old black male known history of HLD HTN Prior cardiac cath 1999 with minimal stenosis Stage IIIb CKD  prostate cancer bladder cancer status post TURBT and induction BCG in 03/29/2017-status post hydrocelectomy 23 [facility] BPH without LUTS  high degree AV block status post PPM placed 2016--underlying history of A-fib/   Feeling weak for about 1 week prior to admission Had a fall 5/3 was down for while he was also dose-limiting and having hard time with hip pain and joint pain Sodium 143 potassium 3.9 AST/ALT slightly elevated 6/22 CK9 85 B12 172 WBC 15 procalcitonin 2.5 hemoglobin 12 platelet 216 urine culture done no growth CT soft tissue neck no evidence of abnormality CT lumbar spine moderate to severe diffuse degenerative disc disease no fracture or listhesis Bilateral hip x-rays severe left and moderate right hip degenerative arthritis CT right knee moderate to severe tricompartmental degenerative changes of the knee  with chondrocalcinosis moderate knee joint effusion   Because of persistent confusion and some hallucinations brain MRI was ordered showing no evidence of acute intracranial abnormality  because of persistently swollen right knee underwent knee aspiration 02/12/2024 showing cloudy appearance 84% neutrophils and indications of pseudogout note Mentation improved quite well over the past several days and he indicated that he really wanted to go home We discussed with him lack of safety and high risk of falls at home and went over this with his family additionally he still elected to go home   Plan   Toxic metabolic encephalopathy 2/2 rhabdo, gout with SIRS criteria and probable underlying dementia B12 close 172, getting 5 days IM, TSH was 1.57 Mild delirium during hospital stay-confusion but orienting well over the past several days He is much clearer at time of discharge and recommend outpatient recheck of B12 levels-overall he is able to make decisions although they may not be good once   Rhabdomyolysis secondary to being found down and immobile Last CK level below 500 about 209--- saline locked on 5/6-his creatinine trended down closer to his normal range and level  Recent neck pain Placed on heating pad no red flags on CT cervical spine imaging this overall seems to be well resolved and he is moving all extremities and has no limitation of AROM   AKI from acute rhabdo superimposed on CKD 3B As above probably contributed to underlying confusion   Leukocytosis gouty arthritis right knee Right knee aspiration as above--blood culture X2 on 5/5 negative---culture from synovial showed abundant WBC but no growth Uric acid was 7.0 Antibiotics discontinued on 5/7 Patient will complete prednisone  for more doses in the  outpatient setting and use high-dose Tylenol  would not use opiates given risk of encephalopathy   bladder cancer TURBT 01/24/2017 final pathology G3 TA and induction 03/20/2018   Prostate cancer iodine 125 previously given Outpatient follow-up Dr. [name] at [facility]   High degree AV block PPM placed 2016 with underlying Not on rate control presumably because of AV block    HTN Holding Vasotec 20 twice daily, Lasix 20 twice daily and have discontinued them completely off of his MAR would not resume as I feel some of his confusion could have been related to AKI on admission Resumed amlodipine  5  Discharge Exam: Vitals:   02/16/24 0508 02/16/24 0809  BP: 131/82 (!) 141/71  Pulse: 68 72  Resp:  18  Temp: 97.6 F (36.4 C) 97.8 F (36.6 C)  SpO2: 100% 100%    Subj on day of d/c   Awake coherent no distress looks well able to tell me date time year and knows that it is Friday relates history of his sister having a sick husband recently A little bit tangential at times but understands when I tell him clearly at my second visit with him this morning that it is not really a good idea for him to go home alone-he declines this and still wishes to do so I have asked him to make sure that his nephew or niece checks in on him   General Exam on discharge  EOMI NCAT no focal deficit no icterus no pallor coherent to time place person Chest is clear no wheeze no rales no rhonchi Abdominal soft no rebound or guarding Range of motion in right lower extremity knee as well as hip intact as well as left lower extremities additionally Power is 5/5 No focal neurological deficit otherwise  Discharge Instructions   Discharge Instructions     Diet - low sodium heart healthy   Complete by: As directed    Discharge instructions   Complete by: As directed    Make sure that you complete the prednisone  dosing as this will help with that inflammation in your knee from the gout--I would continue the colchicine  1 tablet daily and follow-up with your primary physician as an outpatient We will get home health therapy to come out and see you to make sure that you do not have  falls-if you notice unsteadiness or instability would be a good idea to have someone keep a close watch on you at home as you live alone and you have been recommended skilled placement but you declined this  You will notice several medications that change we have stopped some of your fluid pills and blood pressure meds because of the high risk of dehydration and because of worsening of your kidney function so follow-up with primary physician and discuss this once you see them in about 1 or 2 days and get some labs   Increase activity slowly   Complete by: As directed       Allergies as of 02/16/2024       Reactions   Atorvastatin Other (See Comments)   Muscle pain    Fluvastatin Other (See Comments)   Muscle pain    Meloxicam Hypertension   Salsalate Other (See Comments)   Patient cannot recall    Simvastatin Other (See Comments)   Muscle pain         Medication List     STOP taking these medications    cetirizine  5 MG tablet Commonly known as: ZYRTEC   enalapril 20 MG tablet Commonly known as: VASOTEC   furosemide 20 MG tablet Commonly known as: LASIX       TAKE these medications    Acetaminophen  Extra Strength 500 MG Tabs Take 2 tablets (1,000 mg total) by mouth every 6 (six) hours.   amLODipine  5 MG tablet Commonly known as: NORVASC  Take 5 mg by mouth daily.   colchicine  0.6 MG tablet Take 1 tablet (0.6 mg total) by mouth daily. Start taking on: Feb 17, 2024   D 1000 25 MCG (1000 UT) capsule Generic drug: Cholecalciferol Take 5,000 Units by mouth daily as needed (Vit D deficiency).   diclofenac  Sodium 1 % Gel Commonly known as: Voltaren  Apply 2 g topically 4 (four) times daily as needed. What changed: reasons to take this   docusate sodium  100 MG capsule Commonly known as: COLACE Take 1 capsule (100 mg total) by mouth every 12 (twelve) hours.   lidocaine  5 % Commonly known as: Lidoderm  Place 1 patch onto the skin daily. Remove & Discard patch  within 12 hours or as directed by MD   methocarbamol  500 MG tablet Commonly known as: ROBAXIN  Take 500 mg by mouth 2 (two) times daily as needed for muscle spasms.   pantoprazole  40 MG tablet Commonly known as: PROTONIX  Take 40 mg by mouth daily.   pravastatin 80 MG tablet Commonly known as: PRAVACHOL Take 80 mg by mouth at bedtime.   predniSONE  20 MG tablet Commonly known as: DELTASONE  Take 3 tablets (60 mg total) by mouth daily with breakfast for 4 days. Start taking on: Feb 17, 2024   tiZANidine  2 MG tablet Commonly known as: ZANAFLEX  Take 1 tablet (2 mg total) by mouth every 8 (eight) hours as needed for muscle spasms.               Durable Medical Equipment  (From admission, onward)           Start     Ordered   02/16/24 1159  For home use only DME Walker rolling  Once       Question Answer Comment  Walker: With 5 Inch Wheels   Patient needs a walker to treat with the following condition Fall      02/16/24 1159           Allergies  Allergen Reactions   Atorvastatin Other (See Comments)    Muscle pain    Fluvastatin Other (See Comments)    Muscle pain    Meloxicam Hypertension   Salsalate Other (See Comments)    Patient cannot recall    Simvastatin Other (See Comments)    Muscle pain       The results of significant diagnostics from this hospitalization (including imaging, microbiology, ancillary and laboratory) are listed below for reference.    Significant Diagnostic Studies: MR BRAIN WO CONTRAST Result Date: 02/12/2024 CLINICAL DATA:  Psychosis EXAM: MRI HEAD WITHOUT CONTRAST TECHNIQUE: Multiplanar, multiecho pulse sequences of the brain and surrounding structures were obtained without intravenous contrast. COMPARISON:  CT head Feb 08, 2024. FINDINGS: Brain: No acute infarction, hemorrhage, hydrocephalus, extra-axial collection or mass lesion. Cerebral atrophy. Mild for age T2/FLAIR hyperintensities the white matter, nonspecific but  compatible with chronic microvascular ischemic disease. Vascular: Major arterial flow voids are maintained at the skull base. Skull and upper cervical spine: Normal marrow signal. Sinuses/Orbits: Negative. Other: Trace left mastoid effusion. IMPRESSION: No evidence of acute intracranial abnormality. Electronically Signed   By: Stevenson Elbe M.D.   On: 02/12/2024  19:21   CT KNEE RIGHT W CONTRAST Result Date: 02/12/2024 CLINICAL DATA:  Knee pain. EXAM: CT OF THE RIGHT KNEE WITH CONTRAST TECHNIQUE: Multidetector CT imaging was performed following the standard protocol during bolus administration of intravenous contrast. RADIATION DOSE REDUCTION: This exam was performed according to the departmental dose-optimization program which includes automated exposure control, adjustment of the mA and/or kV according to patient size and/or use of iterative reconstruction technique. CONTRAST:  75mL OMNIPAQUE  IOHEXOL  350 MG/ML SOLN COMPARISON:  Right knee radiographs dated 02/09/2024. FINDINGS: Bones/Joint/Cartilage No acute fracture or dislocation. Severe lateral patellofemoral joint space narrowing resulting in bone-on-bone contact, subchondral cystic change, and osteophytosis. Moderate lateral femorotibial compartment joint space narrowing. Amorphous mineralization within the medial and lateral femorotibial compartments, may reflect chondrocalcinosis. Moderate size knee joint effusion. Ligaments Ligaments are suboptimally evaluated by CT. Soft tissue and Muscles Partially visualized fatty atrophy of the medial gastrocnemius muscle. No intramuscular fluid collection or hematoma. IMPRESSION: 1. No acute osseous abnormality. 2. Moderate to severe tricompartmental degenerative changes of the knee with chondrocalcinosis, as above. 3. Moderate knee joint effusion. Electronically Signed   By: Mannie Seek M.D.   On: 02/12/2024 08:31   DG Lumbar Spine 2-3 Views Result Date: 02/10/2024 CLINICAL DATA:  Fall, low back pain  EXAM: LUMBAR SPINE - 2-3 VIEW COMPARISON:  None Available. FINDINGS: Normal lumbar lordosis. No acute fracture or listhesis. Mild sigmoid curvature of the lumbar spine. Vertebral body height is preserved. Intervertebral disc space narrowing and endplate remodeling is seen at L1-L5 in keeping with changes of moderate to severe degenerative disc disease. Paraspinal soft tissues are unremarkable. IMPRESSION: 1. Moderate to severe diffuse degenerative disc disease. No acute fracture or listhesis. Electronically Signed   By: Worthy Heads M.D.   On: 02/10/2024 12:27   DG HIPS BILAT WITH PELVIS 2V Result Date: 02/10/2024 CLINICAL DATA:  Fall, bilateral hip and low back pain EXAM: DG HIP (WITH OR WITHOUT PELVIS) 2V BILAT COMPARISON:  None Available. FINDINGS: Normal alignment. No acute fracture or dislocation. Severe left hip degenerative arthritis with complete loss the joint space, remodeling of the femoral head, and subchondral cyst formation. Mild-to-moderate right hip degenerative arthritis. Sacroiliac joint spaces are preserved. Brachytherapy seeds are seen within the prostate gland. IMPRESSION: 1. Severe left and mild-to-moderate right hip degenerative arthritis. Electronically Signed   By: Worthy Heads M.D.   On: 02/10/2024 12:26   CT Soft Tissue Neck W Contrast Result Date: 02/10/2024 CLINICAL DATA:  Soft tissue infection suspected (Ped 0-17y) EXAM: CT NECK WITH CONTRAST TECHNIQUE: Multidetector CT imaging of the neck was performed using the standard protocol following the bolus administration of intravenous contrast. RADIATION DOSE REDUCTION: This exam was performed according to the departmental dose-optimization program which includes automated exposure control, adjustment of the mA and/or kV according to patient size and/or use of iterative reconstruction technique. CONTRAST:  75mL OMNIPAQUE  IOHEXOL  350 MG/ML SOLN COMPARISON:  None Available. FINDINGS: Pharynx and larynx: Normal. No mass or swelling.  Salivary glands: No inflammation, mass, or stone. Thyroid: Normal. Lymph nodes: None enlarged or abnormal density. Vascular: Major arteries are patent in the neck on limited assessment. Limited intracranial: Negative. Visualized orbits: Negative. Mastoids and visualized paranasal sinuses: Clear. Left mastoidectomy. Skeleton: Multilevel degenerative change including facet and uncovertebral hypertrophy that contributes to varying degrees of neural foraminal stenosis. No acute abnormality on limited assessment. Upper chest: Visualized lung apices are clear. IMPRESSION: No evidence of acute abnormality. Electronically Signed   By: Stevenson Elbe M.D.   On: 02/10/2024  02:50   DG Knee Complete 4 Views Right Result Date: 02/09/2024 CLINICAL DATA:  Recent fall with right knee pain, initial encounter EXAM: RIGHT KNEE - COMPLETE 4+ VIEW COMPARISON:  09/27/2004 FINDINGS: Small joint effusion is noted. Tricompartmental degenerative changes are seen. No acute fracture is identified. IMPRESSION: Degenerative changes with small joint effusion. No acute abnormality noted. Electronically Signed   By: Violeta Grey M.D.   On: 02/09/2024 23:28   DG Chest 2 View Result Date: 02/09/2024 CLINICAL DATA:  Cough.  Patient fell.  Coughing up mucus. EXAM: CHEST - 2 VIEW COMPARISON:  02/08/2024 FINDINGS: Cardiac pacemaker. Shallow inspiration. Heart size and pulmonary vascularity are normal. Lungs are clear. No pleural effusion or pneumothorax. Mediastinal contours appear intact. Degenerative changes in the spine and shoulders. IMPRESSION: Shallow inspiration.  No evidence of active pulmonary disease. Electronically Signed   By: Boyce Byes M.D.   On: 02/09/2024 21:29   DG Chest 2 View Result Date: 02/08/2024 CLINICAL DATA:  Dyspnea EXAM: CHEST - 2 VIEW COMPARISON:  February 03, 2024, October 28, 2023 FINDINGS: Mild cardiomegaly. Left chest pacemaker with leads in the right atrium and right ventricle. Unchanged fine reticular  opacities in the periphery of both lung bases. No new airspace consolidation, pleural effusion, or pneumothorax. Multilevel thoracic osteophytosis. IMPRESSION: No acute cardiopulmonary abnormality. Electronically Signed   By: Rance Burrows M.D.   On: 02/08/2024 11:21   CT Head Wo Contrast Result Date: 02/08/2024 CLINICAL DATA:  Head trauma, minor (Age >= 65y); Neck trauma (Age >= 65y) EXAM: CT HEAD WITHOUT CONTRAST CT CERVICAL SPINE WITHOUT CONTRAST TECHNIQUE: Multidetector CT imaging of the head and cervical spine was performed following the standard protocol without intravenous contrast. Multiplanar CT image reconstructions of the cervical spine were also generated. RADIATION DOSE REDUCTION: This exam was performed according to the departmental dose-optimization program which includes automated exposure control, adjustment of the mA and/or kV according to patient size and/or use of iterative reconstruction technique. COMPARISON:  None Available. FINDINGS: CT HEAD FINDINGS Brain: No evidence of acute infarction, hemorrhage, hydrocephalus, extra-axial collection or mass lesion/mass effect. Patchy white matter hypodensities, nonspecific but compatible with chronic microvascular ischemic disease. Vascular: No hyperdense vessel.  Calcific atherosclerosis. Skull: No acute fracture. Sinuses/Orbits: Clear sinuses.  No acute orbital findings. Other: Left mastoidectomy. CT CERVICAL SPINE FINDINGS Alignment: No substantial sagittal subluxation. Skull base and vertebrae: Vertebral body heights are maintained. No evidence of acute fracture. Soft tissues and spinal canal: No prevertebral fluid or swelling. No visible canal hematoma. Disc levels: Multilevel degenerative change including degenerative disease and facet/uncovertebral hypertrophy with varying degrees of neural foraminal stenosis Upper chest: Visualized lung apices are clear. IMPRESSION: 1. No evidence of acute intracranial abnormality. 2. No evidence of acute  fracture or traumatic malalignment in the cervical spine. Electronically Signed   By: Stevenson Elbe M.D.   On: 02/08/2024 10:32   CT Cervical Spine Wo Contrast Result Date: 02/08/2024 CLINICAL DATA:  Head trauma, minor (Age >= 65y); Neck trauma (Age >= 65y) EXAM: CT HEAD WITHOUT CONTRAST CT CERVICAL SPINE WITHOUT CONTRAST TECHNIQUE: Multidetector CT imaging of the head and cervical spine was performed following the standard protocol without intravenous contrast. Multiplanar CT image reconstructions of the cervical spine were also generated. RADIATION DOSE REDUCTION: This exam was performed according to the departmental dose-optimization program which includes automated exposure control, adjustment of the mA and/or kV according to patient size and/or use of iterative reconstruction technique. COMPARISON:  None Available. FINDINGS: CT HEAD FINDINGS Brain: No evidence  of acute infarction, hemorrhage, hydrocephalus, extra-axial collection or mass lesion/mass effect. Patchy white matter hypodensities, nonspecific but compatible with chronic microvascular ischemic disease. Vascular: No hyperdense vessel.  Calcific atherosclerosis. Skull: No acute fracture. Sinuses/Orbits: Clear sinuses.  No acute orbital findings. Other: Left mastoidectomy. CT CERVICAL SPINE FINDINGS Alignment: No substantial sagittal subluxation. Skull base and vertebrae: Vertebral body heights are maintained. No evidence of acute fracture. Soft tissues and spinal canal: No prevertebral fluid or swelling. No visible canal hematoma. Disc levels: Multilevel degenerative change including degenerative disease and facet/uncovertebral hypertrophy with varying degrees of neural foraminal stenosis Upper chest: Visualized lung apices are clear. IMPRESSION: 1. No evidence of acute intracranial abnormality. 2. No evidence of acute fracture or traumatic malalignment in the cervical spine. Electronically Signed   By: Stevenson Elbe M.D.   On: 02/08/2024  10:32   DG Chest 2 View Result Date: 02/03/2024 CLINICAL DATA:  Cough. EXAM: CHEST - 2 VIEW COMPARISON:  06/08/15 FINDINGS: There is a left chest wall pacer device with leads in the right atrial appendage and right ventricle. Cardiomediastinal contours appear normal. There is no significant pleural effusion or interstitial edema. Fine reticular and nodular interstitial opacities are identified with a peripheral and basilar predominant, greater in the right lung compared with the left. No superimposed interstitial edema or consolidative change. No signs of pneumothorax. IMPRESSION: 1. No acute cardiopulmonary abnormalities. 2. Fine reticular and nodular interstitial opacities with a peripheral and basilar predominant, greater in the right lung compared with the left. Imaging findings are concerning for underlying chronic interstitial lung disease. Consider more definitive characterization with follow-up nonemergent high-resolution CT of the chest. Electronically Signed   By: Kimberley Penman M.D.   On: 02/03/2024 09:16    Microbiology: Recent Results (from the past 240 hours)  Blood culture (routine x 2)     Status: None   Collection Time: 02/09/24  9:56 PM   Specimen: BLOOD LEFT ARM  Result Value Ref Range Status   Specimen Description BLOOD LEFT ARM  Final   Special Requests   Final    BOTTLES DRAWN AEROBIC AND ANAEROBIC Blood Culture results may not be optimal due to an inadequate volume of blood received in culture bottles   Culture   Final    NO GROWTH 5 DAYS Performed at [facility], [address], [place], [place] [address]    Report Status 02/14/2024 FINAL  Final  Blood culture (routine x 2)     Status: None   Collection Time: 02/09/24  9:56 PM   Specimen: BLOOD RIGHT ARM  Result Value Ref Range Status   Specimen Description BLOOD RIGHT ARM  Final   Special Requests   Final    BOTTLES DRAWN AEROBIC ONLY Blood Culture adequate volume   Culture   Final    NO GROWTH 5  DAYS Performed at [facility], [address] [place], [place] [address]    Report Status 02/14/2024 FINAL  Final  Resp panel by RT-PCR (RSV, Flu A&B, Covid) Anterior Nasal Swab     Status: None   Collection Time: 02/09/24 11:07 PM   Specimen: Anterior Nasal Swab  Result Value Ref Range Status   SARS Coronavirus 2 by RT PCR NEGATIVE NEGATIVE Final   Influenza A by PCR NEGATIVE NEGATIVE Final   Influenza B by PCR NEGATIVE NEGATIVE Final    Comment: (NOTE) The Xpert Xpress SARS-CoV-2/FLU/RSV plus assay is intended as an aid in the diagnosis of influenza from Nasopharyngeal swab specimens  and should not be used as a sole basis for treatment. Nasal washings and aspirates are unacceptable for Xpert Xpress SARS-CoV-2/FLU/RSV testing.  Fact Sheet for Patients: BloggerCourse.com  Fact Sheet for Healthcare Providers: SeriousBroker.it  This test is not yet approved or cleared by the [place]  FDA and has been authorized for detection and/or diagnosis of SARS-CoV-2 by FDA under an Emergency Use Authorization (EUA). This EUA will remain in effect (meaning this test can be used) for the duration of the COVID-19 declaration under Section 564(b)(1) of the Act, 21 U.S.C. section 360bbb-3(b)(1), unless the authorization is terminated or revoked.     Resp Syncytial Virus by PCR NEGATIVE NEGATIVE Final    Comment: (NOTE) Fact Sheet for Patients: BloggerCourse.com  Fact Sheet for Healthcare Providers: SeriousBroker.it  This test is not yet approved or cleared by the [place]  FDA and has been authorized for detection and/or diagnosis of SARS-CoV-2 by FDA under an Emergency Use Authorization (EUA). This EUA will remain in effect (meaning this test can be used) for the duration of the COVID-19 declaration under Section 564(b)(1) of the Act, 21 U.S.C. section 360bbb-3(b)(1), unless  the authorization is terminated or revoked.  Performed at [facility], [address], [place], [place] [address]   Urine Culture (for pregnant, neutropenic or urologic patients or patients with an indwelling urinary catheter)     Status: None   Collection Time: 02/10/24  9:51 AM   Specimen: Urine, Clean Catch  Result Value Ref Range Status   Specimen Description URINE, CLEAN CATCH  Final   Special Requests NONE  Final   Culture   Final    NO GROWTH Performed at [facility], [address], [place], [place] [address]    Report Status 02/12/2024 FINAL  Final  Respiratory (~20 pathogens) panel by PCR     Status: None   Collection Time: 02/10/24  3:09 PM   Specimen: Nasopharyngeal Swab; Respiratory  Result Value Ref Range Status   Adenovirus NOT DETECTED NOT DETECTED Final   Coronavirus 229E NOT DETECTED NOT DETECTED Final    Comment: (NOTE) The Coronavirus on the Respiratory Panel, DOES NOT test for the novel  Coronavirus (2019 nCoV)    Coronavirus HKU1 NOT DETECTED NOT DETECTED Final   Coronavirus NL63 NOT DETECTED NOT DETECTED Final   Coronavirus OC43 NOT DETECTED NOT DETECTED Final   Metapneumovirus NOT DETECTED NOT DETECTED Final   Rhinovirus / Enterovirus NOT DETECTED NOT DETECTED Final   Influenza A NOT DETECTED NOT DETECTED Final   Influenza B NOT DETECTED NOT DETECTED Final   Parainfluenza Virus 1 NOT DETECTED NOT DETECTED Final   Parainfluenza Virus 2 NOT DETECTED NOT DETECTED Final   Parainfluenza Virus 3 NOT DETECTED NOT DETECTED Final   Parainfluenza Virus 4 NOT DETECTED NOT DETECTED Final   Respiratory Syncytial Virus NOT DETECTED NOT DETECTED Final   Bordetella pertussis NOT DETECTED NOT DETECTED Final   Bordetella Parapertussis NOT DETECTED NOT DETECTED Final   Chlamydophila pneumoniae NOT DETECTED NOT DETECTED Final   Mycoplasma pneumoniae NOT DETECTED NOT DETECTED Final    Comment: Performed at [facility], [address],  [place], [place] [address]  Culture, blood (Routine X 2) w Reflex to ID Panel     Status: None (Preliminary result)   Collection Time: 02/12/24  6:39 PM   Specimen: BLOOD LEFT HAND  Result Value Ref Range Status   Specimen Description BLOOD LEFT HAND  Final   Special Requests  Final    BOTTLES DRAWN AEROBIC AND ANAEROBIC Blood Culture adequate volume   Culture   Final    NO GROWTH 4 DAYS Performed at [facility], [address], [place], [place] [address]    Report Status PENDING  Incomplete  Culture, blood (Routine X 2) w Reflex to ID Panel     Status: None (Preliminary result)   Collection Time: 02/12/24  6:42 PM   Specimen: BLOOD RIGHT HAND  Result Value Ref Range Status   Specimen Description BLOOD RIGHT HAND  Final   Special Requests   Final    BOTTLES DRAWN AEROBIC AND ANAEROBIC Blood Culture adequate volume   Culture   Final    NO GROWTH 4 DAYS Performed at [facility], [address], [place], [place] [address]    Report Status PENDING  Incomplete  Body fluid culture w Gram Stain     Status: None   Collection Time: 02/12/24  6:56 PM   Specimen: Synovium; Synovial Fluid  Result Value Ref Range Status   Specimen Description SYNOVIAL  Final   Special Requests RIGHT KNEE  Final   Gram Stain   Final    ABUNDANT WBC PRESENT, PREDOMINANTLY PMN NO ORGANISMS SEEN    Culture   Final    NO GROWTH 3 DAYS Performed at [facility], [address], [place], [place] [address]    Report Status 02/16/2024 FINAL  Final     Labs: Basic Metabolic Panel: Recent Labs  Lab 02/10/24 1229 02/11/24 0335 02/12/24 4034 02/13/24 7425 02/14/24 0618 02/15/24 0632 02/16/24 0631  NA 143   < > 139 139 138 138 138  K 3.9   < > 4.4 4.4 4.0 4.7 4.4  CL 109   < > 107 107 106 105 106  CO2 21*   < > 21* 23 24 22 22   GLUCOSE 80   < > 96 108* 107* 107* 83  BUN 25*   < > 23 22 22  26* 27*  CREATININE 1.42*   < > 1.45* 1.43* 1.56* 1.22 1.34*  CALCIUM 9.2   < > 8.8* 8.5* 9.1 9.2  9.1  MG 1.9  --  1.9 1.9 1.9  --   --   PHOS 2.8  --  3.8 3.5 2.8  --   --    < > = values in this interval not displayed.   Liver Function Tests: Recent Labs  Lab 02/10/24 1229 02/11/24 0335 02/12/24 0714 02/13/24 0623 02/14/24 0618  AST 66* 46* 42* 35 39  ALT 22 23 29 26  32  ALKPHOS 52 43 49 47 62  BILITOT 1.0 0.9 0.8 0.9 0.9  PROT 7.4 6.5 6.3* 6.0* 6.8  ALBUMIN 3.2* 2.7* 2.5* 2.2* 2.3*   No results for input(s): "LIPASE", "AMYLASE" in the last 168 hours. Recent Labs  Lab 02/10/24 1229  AMMONIA 21   CBC: Recent Labs  Lab 02/12/24 0714 02/13/24 0623 02/14/24 0618 02/15/24 0632 02/16/24 0631  WBC 15.2* 14.4* 17.2* 20.7* 15.6*  NEUTROABS 10.9* 10.7* 13.2* 16.6* 12.2*  HGB 11.1* 10.3* 11.3* 11.5* 11.1*  HCT 34.7* 31.8* 34.7* 35.8* 33.6*  MCV 92.0 90.9 90.6 91.8 90.3  PLT 256 255 283 294 394   Cardiac Enzymes: Recent Labs  Lab 02/09/24 2110 02/10/24 1229 02/11/24 0335 02/12/24 0714 02/13/24 0622  CKTOTAL 1,652* 985* 351 479* 209   BNP: BNP (last 3 results) No results for input(s): "BNP" in the last 8760 hours.  ProBNP (  last 3 results) No results for input(s): "PROBNP" in the last 8760 hours.  CBG: No results for input(s): "GLUCAP" in the last 168 hours.  Signed:  Jai-Gurmukh Kelden Lavallee MD   [facility] 02/16/2024, 11:52 AM

## 2024-02-16 NOTE — Plan of Care (Signed)

## 2024-02-16 NOTE — TOC Progression Note (Signed)
 Transition of Care [facility]) - Progression Note    Patient Details  Name: Richard Osborne MRN: [id number] Date of Birth: [birth year]  Transition of Care [facility]) CM/SW Contact  Laiah Pouncey A [place], LCSW Phone Number: 02/16/2024, 9:49 AM  Clinical Narrative:     CSW spoke with pt regarding SNF bed choice. He reiterated that he still wanted to return home versus going to SNF.  He states that he can get up and go to the bathroom on his own and doesn't need help with transfers. CSW notified pt's treatment team to assist with disposition for pt.  CSW asked if pt wants CSW to contact his sisters, said "leave her out of it."  Pt said that if he goes home, he will need equipment recommended by PT/OT.   States that he has family/friends that can provide assistance with transportation at discharge if safe to return home.   TOC will continue to follow.   Expected Discharge Plan: Skilled Nursing Facility Barriers to Discharge: SNF Pending bed offer, Continued Medical Work up, English as a second language teacher  Expected Discharge Plan and Services In-house Referral: Clinical Social Work     Living arrangements for the past 2 months: Single Family Home                                       Social Determinants of Health (SDOH) Interventions SDOH Screenings   Food Insecurity: No Food Insecurity (02/10/2024)  Housing: Low Risk  (02/10/2024)  Transportation Needs: No Transportation Needs (02/10/2024)  Utilities: Not At Risk (02/10/2024)  Social Connections: Unknown (02/10/2024)  Tobacco Use: Low Risk  (02/09/2024)    Readmission Risk Interventions     No data to display

## 2024-02-16 NOTE — TOC CM/SW Note (Addendum)
 Patient declining SNF at discharge.   NCM spoke to patient at bedside. He states he can stay with his sister at discharge and she can help.   Patient gave NCM permission to call sister. NCM called sister and placed her on speaker phone.     His sister  said he cannot go home there is no one to assist. He cannot go to her place , she is almost as old as he is and she cannot assist. NCM  read mobility tech note to her and him ( walked 3 feet) , and PT note 2 person assist and doesn't look like he ambulated at all.   Patient told sister he can get up by himself and walk to the bathroom by himself . NCM instructed patient not to . Currently all four bed rails are up and fall mat on floor.   Patient continues to decline SNF   NCM secure chatted team   PT in agreement he cannot go home at discharge.   Patient still declining SNF.   NCM asked sister and patient to continue discussing discharge plan   1130 Mobility tech reported patient did better today. MD spoke with patient , patient continues to decline SNF wanting to go home at discharge. Patient states he has two walkers at home already. Patient has called his nephew to come pick him up. Patient did not consent for NCM to call nephew .    NCM called Loetta Ringer with [facility] she cannot accept home health referral.   Artavia with [facility] checking . Artavia accepted they will see him Sunday

## 2024-02-16 NOTE — Plan of Care (Signed)

## 2024-02-17 LAB — CULTURE, BLOOD (ROUTINE X 2)
Culture: NO GROWTH
Culture: NO GROWTH
Special Requests: ADEQUATE
Special Requests: ADEQUATE
